# Patient Record
Sex: Male | Born: 1990 | Race: White | Hispanic: Yes | Marital: Single | Smoking: Never smoker
Health system: Southern US, Community
[De-identification: ages and names within clinical notes are randomized; demographics above are authoritative.]

## PROBLEM LIST (undated history)

## (undated) DIAGNOSIS — R12 Heartburn: Secondary | ICD-10-CM

## (undated) DIAGNOSIS — F4320 Adjustment disorder, unspecified: Secondary | ICD-10-CM

## (undated) DIAGNOSIS — F419 Anxiety disorder, unspecified: Secondary | ICD-10-CM

## (undated) DIAGNOSIS — K859 Acute pancreatitis without necrosis or infection, unspecified: Secondary | ICD-10-CM

## (undated) DIAGNOSIS — T7840XA Allergy, unspecified, initial encounter: Secondary | ICD-10-CM

## (undated) DIAGNOSIS — L700 Acne vulgaris: Secondary | ICD-10-CM

## (undated) DIAGNOSIS — K219 Gastro-esophageal reflux disease without esophagitis: Secondary | ICD-10-CM

## (undated) HISTORY — DX: Acute pancreatitis without necrosis or infection, unspecified: K85.90

## (undated) HISTORY — DX: Heartburn: R12

## (undated) HISTORY — DX: Acne vulgaris: L70.0

## (undated) HISTORY — DX: Anxiety disorder, unspecified: F41.9

## (undated) HISTORY — DX: Gastro-esophageal reflux disease without esophagitis: K21.9

## (undated) HISTORY — DX: Adjustment disorder, unspecified: F43.20

## (undated) HISTORY — DX: Allergy, unspecified, initial encounter: T78.40XA

---

## 2014-01-13 ENCOUNTER — Encounter: Payer: Self-pay | Admitting: Family Medicine

## 2014-07-21 NOTE — Progress Notes (Signed)
This encounter was created in error - please disregard.

## 2014-09-17 DIAGNOSIS — F4320 Adjustment disorder, unspecified: Secondary | ICD-10-CM

## 2014-09-17 HISTORY — DX: Adjustment disorder, unspecified: F43.20

## 2014-11-26 ENCOUNTER — Encounter: Payer: Self-pay | Admitting: Medical

## 2014-11-26 ENCOUNTER — Other Ambulatory Visit (HOSPITAL_COMMUNITY)
Admission: RE | Admit: 2014-11-26 | Discharge: 2014-11-26 | Disposition: A | Discharge status: Home / Self Care | Payer: Self-pay | Source: Ambulatory Visit | Attending: Medical | Admitting: Medical

## 2014-11-26 ENCOUNTER — Ambulatory Visit (INDEPENDENT_AMBULATORY_CARE_PROVIDER_SITE_OTHER): Payer: Self-pay | Admitting: Medical

## 2014-11-26 VITALS — BP 112/74 | HR 73 | Temp 98.1°F | Ht 67.2 in | Wt 123.0 lb

## 2014-11-26 DIAGNOSIS — Z8719 Personal history of other diseases of the digestive system: Secondary | ICD-10-CM

## 2014-11-26 DIAGNOSIS — R300 Dysuria: Secondary | ICD-10-CM

## 2014-11-26 DIAGNOSIS — R82998 Other abnormal findings in urine: Secondary | ICD-10-CM

## 2014-11-26 DIAGNOSIS — A5601 Chlamydial cystitis and urethritis: Secondary | ICD-10-CM | POA: Insufficient documentation

## 2014-11-26 DIAGNOSIS — N341 Nonspecific urethritis: Secondary | ICD-10-CM

## 2014-11-26 DIAGNOSIS — N390 Urinary tract infection, site not specified: Secondary | ICD-10-CM

## 2014-11-26 DIAGNOSIS — R739 Hyperglycemia, unspecified: Secondary | ICD-10-CM

## 2014-11-26 DIAGNOSIS — Z113 Encounter for screening for infections with a predominantly sexual mode of transmission: Secondary | ICD-10-CM | POA: Insufficient documentation

## 2014-11-26 DIAGNOSIS — Z202 Contact with and (suspected) exposure to infections with a predominantly sexual mode of transmission: Secondary | ICD-10-CM

## 2014-11-26 LAB — LIPASE: Lipase: 57 U/L (ref 11.0–59.0)

## 2014-11-26 LAB — POCT URINALYSIS DIPSTICK
GLUCOSE UA: NEGATIVE
Ketones, UA: NEGATIVE
Leukocytes, UA: NEGATIVE
Nitrite, UA: NEGATIVE
Protein, UA: 30
SPEC GRAV UA: 1.025
UROBILINOGEN UA: 0.2
pH, UA: 6

## 2014-11-26 LAB — HEMOGLOBIN A1C: Hgb A1c MFr Bld: 5 % (ref 4.6–6.5)

## 2014-11-26 MED ORDER — AZITHROMYCIN 250 MG PO TABS: ORAL_TABLET | ORAL | Status: DC

## 2014-11-26 NOTE — Progress Notes (Signed)
Subjective:    Patient ID: George Klein, male    DOB: 08-14-1991, 24 y.o.   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  Pt in states he went to ED about 2 weeks ago. [facility] [place]. He was diagnosed with uti per pt.  Review of records showed US showed bilateral epidymal cyst. Pt states testicle pain on lt side resolved after one day and no return. Pt was given cephalexin but bothered his stomach and he could not take any more than 4 tabs.  Pt cbc and cmp looked good. BS only 107. No elevated white count.  Pt states recently his pain on urination came on about one week ago. This time no testicle pain. No dc from penis. No vesicles or lesions.  2 months since last time had sex.  Pt has been taking azo.  Review of Systems  Constitutional: Negative for fever, chills and fatigue.  Respiratory: Negative for cough, chest tightness, shortness of breath and wheezing.   Cardiovascular: Negative for chest pain and palpitations.  Gastrointestinal: Negative for abdominal pain.  Genitourinary: Positive for dysuria. Negative for urgency, frequency, flank pain, discharge, penile swelling, scrotal swelling, difficulty urinating, genital sores, penile pain and testicular pain.  Musculoskeletal: Negative for back pain.   Past Medical History  Diagnosis Date  . Pancreatitis     Pt states occured after one month of cephalexin. He was drinking alcohol very light and rare. But states preceding month he did not drink alchohol.    History   Social History  . Marital Status: Single    Spouse Name: N/A  . Number of Children: N/A  . Years of Education: N/A   Occupational History  . Not on file.   Social History Main Topics  . Smoking status: Never Smoker   . Smokeless tobacco: Not on  file  . Alcohol Use: Yes     Comment: 2 beers every other week.  . Drug Use: No  . Sexual Activity: No   Other Topics Concern  . Not on file   Social History Narrative    No past surgical history on file.  No family history on file.  Allergies  Allergen Reactions  . Keflex [Cephalexin] Nausea And Vomiting    Current Outpatient Prescriptions on File Prior to Visit  Medication Sig Dispense Refill  . clindamycin (CLEOCIN T) 1 % lotion Apply topically 2 (two) times daily.     No current facility-administered medications on file prior to visit.    BP 112/74 mmHg  Pulse 73  Temp(Src) 98.1 F (36.7 C) (Oral)  Ht 5' 7.2" (1.707 m)  Wt 123 lb (55.792 kg)  BMI 19.15 kg/m2  SpO2 100%      Objective:   Physical Exam  General Appearance- Not in acute distress.  HEENT Eyes- Scleraeral/Conjuntiva-bilat- Not Yellow. Mouth & Throat- Normal.  Chest and Lung Exam Auscultation: Breath sounds:-Normal. Adventitious sounds:- No Adventitious sounds.  Cardiovascular Auscultation:Rythm - Regular. Heart Sounds -Normal heart sounds.  Abdomen Inspection:-Inspection Normal.  Palpation/Perucssion: Palpation and Percussion of the abdomen reveal- Non Tender, No Rebound tenderness, No rigidity(Guarding) and No Palpable abdominal masses.  Liver:-Normal.  Spleen:- Normal.   Genital exam- Uncircumcised .faint water appearing dc over glands penis. No cream dc. Testicles nontender. No hernias.      Assessment &  Plan:

## 2014-11-26 NOTE — Assessment & Plan Note (Signed)
Will get urine ancillary studies for recent symptoms and possible exposure to std.

## 2014-11-26 NOTE — Patient Instructions (Signed)
Nonspecific urethritis Will rx azithromycin. Since you have hx of GI pain with cephalexin I think azithromycin better option compared to doxycycline.   However both may cause GI symptoms.   Dysuria Will get urine ancillary studies for recent symptoms and possible exposure to std.   Hyperglycemia Being uncirumcised can lead to fungal irritation if sugar is high. Hx of mild sugar increase in ED. Will get 3 months bs average today.   History of pancreatitis 2 years ago. Will get lipase today when drawing labs.      If all labs negative and urinary symptoms persist then consider referral to urologist. Follow up in 2 weeks or as needed.

## 2014-11-26 NOTE — Assessment & Plan Note (Signed)
Being uncirumcised can lead to fungal irritation if sugar is high. Hx of mild sugar increase in ED. Will get 3 months bs average today.

## 2014-11-26 NOTE — Progress Notes (Signed)
Pre visit review using our clinic review tool, if applicable. No additional management support is needed unless otherwise documented below in the visit note. 

## 2014-11-26 NOTE — Assessment & Plan Note (Signed)
Will rx azithromycin. Since you have hx of GI pain with cephalexin I think azithromycin better option compared to doxycycline.   However both may cause GI symptoms.

## 2014-11-26 NOTE — Assessment & Plan Note (Signed)
2 years ago. Will get lipase today when drawing labs.

## 2014-11-27 LAB — URINE CULTURE

## 2014-11-29 LAB — URINE CYTOLOGY ANCILLARY ONLY
CANDIDA VAGINITIS: NEGATIVE
CHLAMYDIA, DNA PROBE: POSITIVE — AB
Neisseria Gonorrhea: NEGATIVE
Trichomonas: NEGATIVE

## 2014-11-30 ENCOUNTER — Telehealth: Payer: Self-pay | Admitting: Medical

## 2014-11-30 NOTE — Telephone Encounter (Signed)
Note regarding chlamydia

## 2014-12-09 ENCOUNTER — Ambulatory Visit: Payer: Self-pay | Admitting: Medical

## 2014-12-15 ENCOUNTER — Encounter: Payer: Self-pay | Admitting: Medical

## 2014-12-15 ENCOUNTER — Telehealth: Payer: Self-pay | Admitting: Medical

## 2014-12-15 NOTE — Telephone Encounter (Signed)
Do no charge. But can someone call him and explain policy.

## 2014-12-15 NOTE — Telephone Encounter (Signed)
PT was no show for appt on 12/09/2014- letter sent- charge?

## 2014-12-28 ENCOUNTER — Ambulatory Visit (INDEPENDENT_AMBULATORY_CARE_PROVIDER_SITE_OTHER): Payer: Self-pay | Admitting: Medical

## 2014-12-28 ENCOUNTER — Telehealth: Payer: Self-pay | Admitting: Medical

## 2014-12-28 ENCOUNTER — Encounter: Payer: Self-pay | Admitting: Medical

## 2014-12-28 ENCOUNTER — Other Ambulatory Visit (HOSPITAL_COMMUNITY)
Admission: RE | Admit: 2014-12-28 | Discharge: 2014-12-28 | Disposition: A | Discharge status: Home / Self Care | Payer: Self-pay | Source: Ambulatory Visit | Attending: Medical | Admitting: Medical

## 2014-12-28 VITALS — BP 116/75 | HR 72 | Temp 98.2°F | Ht 67.2 in | Wt 123.6 lb

## 2014-12-28 DIAGNOSIS — N342 Other urethritis: Secondary | ICD-10-CM

## 2014-12-28 DIAGNOSIS — Z113 Encounter for screening for infections with a predominantly sexual mode of transmission: Secondary | ICD-10-CM | POA: Insufficient documentation

## 2014-12-28 DIAGNOSIS — R309 Painful micturition, unspecified: Secondary | ICD-10-CM

## 2014-12-28 LAB — POCT URINALYSIS DIPSTICK
Bilirubin, UA: NEGATIVE
Glucose, UA: NEGATIVE
Ketones, UA: NEGATIVE
LEUKOCYTES UA: NEGATIVE
Nitrite, UA: NEGATIVE
PROTEIN UA: 15
Spec Grav, UA: 1.025
Urobilinogen, UA: 0.2
pH, UA: 6

## 2014-12-28 MED ORDER — DOXYCYCLINE HYCLATE 100 MG PO TABS: 100.0000 mg | ORAL_TABLET | Freq: Two times a day (BID) | ORAL | Status: DC

## 2014-12-28 NOTE — Progress Notes (Signed)
Pre visit review using our clinic review tool, if applicable. No additional management support is needed unless otherwise documented below in the visit note. 

## 2014-12-28 NOTE — Assessment & Plan Note (Signed)
Possible recurrent chlamydia vs nonspecific urethritis. Rx doxycycline. Would get girlfriend evaluated again in light of fact she is your only contact.  We will contact you on your ancillary studies.

## 2014-12-28 NOTE — Patient Instructions (Signed)
Urethritis Possible recurrent chlamydia vs nonspecific urethritis. Rx doxycycline. Would get girlfriend evaluated again in light of fact she is your only contact.  We will contact you on your ancillary studies.    Follow up 7 days pcp or prn.

## 2014-12-28 NOTE — Progress Notes (Signed)
   Subjective:    Patient ID: George Klein, male    DOB: 1991/05/15, 24 y.o.   MRN: [id number]  HPI   Pt in states that he has some mild pain occasional at tip of penis. He states this occurs when he gets an erection. But also some pain at times after he urinates. He had + chlamydia on urine ancillary studies a month ago and took azithromycin. Pt had sex with his girlfriend. Pt not sure if his girlfriend was treated for chlamydia. Pt states girlfriend was out of town and he thinks she did not go to see provider. Then girlfriend mentions she may have gotten voicemail from health dept.  Later he states she went but they did not treat his girlfriend.     Review of Systems  Constitutional: Negative for fever, chills and fatigue.  Respiratory: Negative for cough, chest tightness, shortness of breath and wheezing.   Cardiovascular: Negative for chest pain and palpitations.  Gastrointestinal: Negative for abdominal pain.  Genitourinary: Positive for dysuria and difficulty urinating. Negative for urgency, frequency, hematuria, flank pain, genital sores, penile pain and testicular pain.  Musculoskeletal: Negative for back pain.  Hematological: Negative for adenopathy. Does not bruise/bleed easily.   Past Medical History  Diagnosis Date  . Pancreatitis     Pt states occured after one month of cephalexin. He was drinking alcohol very light and rare. But states preceding month he did not drink alchohol.    History   Social History  . Marital Status: Single    Spouse Name: N/A  . Number of Children: N/A  . Years of Education: N/A   Occupational History  . Not on file.   Social History Main Topics  . Smoking status: Never Smoker   . Smokeless tobacco: Not on file  . Alcohol Use: Yes     Comment: 2 beers every other week.  . Drug Use: No  . Sexual Activity: No   Other Topics Concern  . Not on file   Social History Narrative    No past surgical history on file.  No family  history on file.  Allergies  Allergen Reactions  . Keflex [Cephalexin] Nausea And Vomiting    Current Outpatient Prescriptions on File Prior to Visit  Medication Sig Dispense Refill  . clindamycin (CLEOCIN T) 1 % lotion Apply topically 2 (two) times daily.    Marland Kitchen. tretinoin (RETIN-A) 0.01 % gel Apply topically at bedtime.     No current facility-administered medications on file prior to visit.    BP 116/75 mmHg  Pulse 72  Temp(Src) 98.2 F (36.8 C) (Oral)  Ht 5' 7.2" (1.707 m)  Wt 123 lb 9.6 oz (56.065 kg)  BMI 19.24 kg/m2  SpO2 97%       Objective:   Physical Exam  General Appearance- Not in acute distress.    Chest and Lung Exam Auscultation: Breath sounds:-Normal. Adventitious sounds:- No Adventitious sounds.  Cardiovascular Auscultation:Rythm - Regular. Heart Sounds -Normal heart sounds.  Abdomen Inspection:-Inspection Normal.  Palpation/Perucssion: Palpation and Percussion of the abdomen reveal- Non Tender, No Rebound tenderness, No rigidity(Guarding) and No Palpable abdominal masses.  Liver:-Normal.  Spleen:- Normal.   Genital- uncircumcised. No lesion on penis. No dc.       Assessment & Plan:

## 2014-12-28 NOTE — Telephone Encounter (Signed)
Not to lpn. To go ancillary urine studies.

## 2014-12-29 LAB — URINE CYTOLOGY ANCILLARY ONLY
CHLAMYDIA, DNA PROBE: NEGATIVE
NEISSERIA GONORRHEA: NEGATIVE
Trichomonas: NEGATIVE

## 2014-12-30 LAB — URINE CYTOLOGY ANCILLARY ONLY: Candida vaginitis: NEGATIVE

## 2015-06-28 ENCOUNTER — Encounter: Payer: Self-pay | Admitting: Medical

## 2015-06-28 ENCOUNTER — Ambulatory Visit (INDEPENDENT_AMBULATORY_CARE_PROVIDER_SITE_OTHER): Payer: Self-pay | Admitting: Medical

## 2015-06-28 VITALS — BP 110/70 | HR 64 | Temp 97.4°F | Ht 67.0 in | Wt 120.0 lb

## 2015-06-28 DIAGNOSIS — J0120 Acute ethmoidal sinusitis, unspecified: Secondary | ICD-10-CM

## 2015-06-28 DIAGNOSIS — J309 Allergic rhinitis, unspecified: Secondary | ICD-10-CM

## 2015-06-28 MED ORDER — FLUTICASONE PROPIONATE 50 MCG/ACT NA SUSP: 2.0000 | Freq: Every day | NASAL | Status: DC

## 2015-06-28 MED ORDER — DOXYCYCLINE HYCLATE 100 MG PO TABS: 100.0000 mg | ORAL_TABLET | Freq: Two times a day (BID) | ORAL | Status: DC

## 2015-06-28 MED ORDER — LORATADINE 10 MG PO TABS: 10.0000 mg | ORAL_TABLET | Freq: Every day | ORAL | Status: DC

## 2015-06-28 NOTE — Patient Instructions (Signed)
Allergic rhinitis likely- rx flonase nasal spray. If needed can use claritin as well.  Sinusitis- some ethmoid sinus pressure presently. I want you to use above first. If you sinus pressure worsens or changes then start doxycycline.  Follow up 7-10 days any persisting symptoms or as needed.

## 2015-06-28 NOTE — Progress Notes (Signed)
Pre visit review using our clinic review tool, if applicable. No additional management support is needed unless otherwise documented below in the visit note. 

## 2015-06-28 NOTE — Progress Notes (Signed)
Subjective:    Patient ID: George Klein, male    DOB: 09-23-1990, 24 y.o.   MRN: [id number]  HPI   Pt in with nasal congestion and sinus congestion. Pt eyes have been itching. Some sneezing. No runny nose. Reports no pnd. These symptoms have been going on 2 wks.  Early on he felt fever.  When he leans over describes pain in ethomoid sinus region.     Review of Systems  Constitutional: Positive for fever. Negative for chills and fatigue.  HENT: Positive for congestion, sinus pressure and sneezing. Negative for facial swelling, postnasal drip, rhinorrhea and sore throat.   Eyes: Positive for itching.  Respiratory: Negative for cough, choking, chest tightness, shortness of breath and wheezing.   Cardiovascular: Negative for chest pain and palpitations.  Gastrointestinal: Negative for abdominal pain.  Musculoskeletal: Negative for back pain.  Neurological: Negative for dizziness and headaches.  Hematological: Negative for adenopathy. Does not bruise/bleed easily.  Psychiatric/Behavioral: Negative for behavioral problems and confusion.   Past Medical History  Diagnosis Date  . Pancreatitis     Pt states occured after one month of cephalexin. He was drinking alcohol very light and rare. But states preceding month he did not drink alchohol.    Social History   Social History  . Marital Status: Single    Spouse Name: N/A  . Number of Children: N/A  . Years of Education: N/A   Occupational History  . Not on file.   Social History Main Topics  . Smoking status: Never Smoker   . Smokeless tobacco: Not on file  . Alcohol Use: Yes     Comment: 2 beers every other week.  . Drug Use: No  . Sexual Activity: No   Other Topics Concern  . Not on file   Social History Narrative    No past surgical history on file.  No family history on file.  Allergies  Allergen Reactions  . Keflex [Cephalexin] Nausea And Vomiting    No current outpatient prescriptions on file  prior to visit.   No current facility-administered medications on file prior to visit.    BP 110/70 mmHg  Pulse 64  Temp(Src) 97.4 F (36.3 C) (Oral)  Ht  (1.702 m)  Wt 120 lb (54.432 kg)  BMI 18.79 kg/m2  SpO2 98%      Objective:   Physical Exam  General  Mental Status - Alert. General Appearance - Well groomed. Not in acute distress.  Skin Rashes- No Rashes.  HEENT Head- Normal. Ear Auditory Canal - Left- Normal. Right - Normal.Tympanic Membrane- Left- Normal. Right- Normal. Eye Sclera/Conjunctiva- Left- Normal. Right- Normal. Nose & Sinuses Nasal Mucosa- Left-  Boggy and Congested. Right-  Boggy and  Congested.Bilateral no  maxillary and no  frontal sinus pressure.(but some ethmoid sinus region pain). Mouth & Throat Lips: Upper Lip- Normal: no dryness, cracking, pallor, cyanosis, or vesicular eruption. Lower Lip-Normal: no dryness, cracking, pallor, cyanosis or vesicular eruption. Buccal Mucosa- Bilateral- No Aphthous ulcers. Oropharynx- No Discharge or Erythema. +pnd. Tonsils: Characteristics- Bilateral- No Erythema or Congestion. Size/Enlargement- Bilateral- No enlargement. Discharge- bilateral-None.  Neck Neck- Supple. No Masses.   Chest and Lung Exam Auscultation: Breath Sounds:-Clear even and unlabored.  Cardiovascular Auscultation:Rythm- Regular, rate and rhythm. Murmurs & Other Heart Sounds:Ausculatation of the heart reveal- No Murmurs.  Lymphatic Head & Neck General Head & Neck Lymphatics: Bilateral: Description- No Localized lymphadenopathy.       Assessment & Plan:  Allergic rhinitis likely- rx flonase nasal  spray. If needed can use claritin as well.  Sinusitis- some ethmoid sinus pressure presently. I want you to use above first. If you sinus pressure worsens or changes then start doxycycline.  Follow up 7-10 days any persisting symptoms or as needed.

## 2015-07-18 ENCOUNTER — Ambulatory Visit (INDEPENDENT_AMBULATORY_CARE_PROVIDER_SITE_OTHER): Payer: Self-pay | Admitting: Medical

## 2015-07-18 ENCOUNTER — Encounter: Payer: Self-pay | Admitting: Medical

## 2015-07-18 VITALS — BP 102/60 | HR 67 | Temp 98.3°F | Ht 67.0 in | Wt 119.0 lb

## 2015-07-18 DIAGNOSIS — Z0189 Encounter for other specified special examinations: Secondary | ICD-10-CM

## 2015-07-18 DIAGNOSIS — E875 Hyperkalemia: Secondary | ICD-10-CM

## 2015-07-18 DIAGNOSIS — Z0000 Encounter for general adult medical examination without abnormal findings: Secondary | ICD-10-CM

## 2015-07-18 DIAGNOSIS — Z113 Encounter for screening for infections with a predominantly sexual mode of transmission: Secondary | ICD-10-CM

## 2015-07-18 DIAGNOSIS — Z23 Encounter for immunization: Secondary | ICD-10-CM

## 2015-07-18 LAB — POCT URINALYSIS DIPSTICK
Bilirubin, UA: NEGATIVE
Blood, UA: NEGATIVE
Glucose, UA: NEGATIVE
Ketones, UA: NEGATIVE
LEUKOCYTES UA: NEGATIVE
Nitrite, UA: NEGATIVE
PROTEIN UA: NEGATIVE
Spec Grav, UA: 1.025
UROBILINOGEN UA: 0.2
pH, UA: 7

## 2015-07-18 NOTE — Assessment & Plan Note (Addendum)
Will get cbc, cmp, tsh, lipid panel today. Check ua as well. Will get flu vaccine today.

## 2015-07-18 NOTE — Progress Notes (Signed)
Pre visit review using our clinic review tool, if applicable. No additional management support is needed unless otherwise documented below in the visit note. 

## 2015-07-18 NOTE — Progress Notes (Signed)
Subjective:    Patient ID: George Klein, male    DOB: Apr 28, 1991, 24 y.o.   MRN: [id number]  HPI  Pt in for evaluation. Pt states his mother wanted him to have a physical.  Pt has not gotten flu vaccine this year.  Pt is fasting today.   Review of Systems  Constitutional: Negative for fever, chills, diaphoresis, activity change and fatigue.  HENT: Negative for dental problem, ear discharge, ear pain, mouth sores, nosebleeds, rhinorrhea and sinus pressure.        Pt states when he swallows has odd sensation over his thyroid.  Eyes:       Eys feel tired.  Respiratory: Negative for cough, chest tightness and shortness of breath.   Cardiovascular: Negative for chest pain, palpitations and leg swelling.  Gastrointestinal: Positive for constipation. Negative for nausea, vomiting and abdominal pain.       Pt mentioned in July one week of mild constipation feellng. Then this resolved.  Some mild reflux symptoms as well.   Musculoskeletal: Negative for back pain, neck pain and neck stiffness.       Pt mentions that he gets occasionally tingling sensation to his feet.   Skin: Negative for rash.  Neurological: Negative for dizziness, syncope, facial asymmetry, speech difficulty, weakness and headaches.       Thinks some slight head pressure sensations.  Hematological: Negative for adenopathy. Does not bruise/bleed easily.  Psychiatric/Behavioral: Negative for behavioral problems, confusion and agitation. The patient is nervous/anxious.        Pt states about 3 times a week will get very nervous sensation 15-20 minutes then will subside.  Described sensation on panic extreme worry about 2 months ago.   Past Medical History  Diagnosis Date  . Pancreatitis     Pt states occured after one month of cephalexin. He was drinking alcohol very light and rare. But states preceding month he did not drink alchohol.    Social History   Social History  . Marital Status: Single    Spouse Name:  N/A  . Number of Children: N/A  . Years of Education: N/A   Occupational History  . Not on file.   Social History Main Topics  . Smoking status: Never Smoker   . Smokeless tobacco: Not on file  . Alcohol Use: Yes     Comment: 2 beers every other week.  . Drug Use: No  . Sexual Activity: No   Other Topics Concern  . Not on file   Social History Narrative    No past surgical history on file.  No family history on file.  Allergies  Allergen Reactions  . Keflex [Cephalexin] Nausea And Vomiting    Current Outpatient Prescriptions on File Prior to Visit  Medication Sig Dispense Refill  . doxycycline (VIBRA-TABS) 100 MG tablet Take 1 tablet (100 mg total) by mouth 2 (two) times daily. 20 tablet 0  . fluticasone (FLONASE) 50 MCG/ACT nasal spray Place 2 sprays into both nostrils daily. 16 g 1  . loratadine (CLARITIN) 10 MG tablet Take 1 tablet (10 mg total) by mouth daily. 30 tablet 0   No current facility-administered medications on file prior to visit.    BP 102/60 mmHg  Pulse 67  Temp(Src) 98.3 F (36.8 C) (Oral)  Ht  (1.702 m)  Wt 119 lb (53.978 kg)  BMI 18.63 kg/m2  SpO2 98%       Objective:   Physical Exam  General Mental Status- Alert. General Appearance- Not  in acute distress.   Skin General: Color- Normal Color. Moisture- Normal Moisture.  Neck Carotid Arteries- Normal color. Moisture- Normal Moisture. No carotid bruits. No JVD.  Chest and Lung Exam Auscultation: Breath Sounds:-Normal.  Cardiovascular Auscultation:Rythm- Regular, Rate and rhythm. Murmurs & Other Heart Sounds:Auscultation of the heart reveals- No Murmurs.  Abdomen Inspection:-Inspeection Normal. Palpation/Percussion:Note:No mass. Palpation and Percussion of the abdomen reveal- Non Tender, Non Distended + BS, no rebound or guarding.    Neurologic Cranial Nerve exam:- CN III-XII intact(No nystagmus), symmetric smile. Strength:- 5/5 equal and symmetric strength both  upper and lower extremities.      Assessment & Plan:  .You mentioned various mild intermittent symptoms. For heart burn use zantac otc and eat healthy.  For your other intermittent signs and symptoms will consider further  work up for each if those symptoms  return or if physical exam labs show abnormality.  I will over you ssri type medication if your anxiety is daily/recurrent.  Follow up in 3 wks or as needed.

## 2015-07-18 NOTE — Addendum Note (Signed)
Addended by: Neldon LabellaMABE, Myla Mauriello S on: 07/18/2015 04:09 PM   Modules accepted: Orders

## 2015-07-18 NOTE — Patient Instructions (Addendum)
Wellness examination Will get cbc, cmp, tsh, lipid panel today. Check ua as well. Will get flu vaccine today.    You mentioned various mild intermittent symptoms. For heart burn use zantac otc and eat healthy.  For your other intermittent signs and symptoms will consider further  work up for each if those symptoms  return or if physical exam labs show abnormality.  I will over you ssri type medication if your anxiety is daily/recurrent.  Follow up in 3 wks or as needed.  Preventive Care for Adults, Male A healthy lifestyle and preventive care can promote health and wellness. Preventive health guidelines for men include the following key practices:  A routine yearly physical is a good way to check with your health care provider about your health and preventative screening. It is a chance to share any concerns and updates on your health and to receive a thorough exam.  Visit your dentist for a routine exam and preventative care every 6 months. Brush your teeth twice a day and floss once a day. Good oral hygiene prevents tooth decay and gum disease.  The frequency of eye exams is based on your age, health, family medical history, use of contact lenses, and other factors. Follow your health care provider's recommendations for frequency of eye exams.  Eat a healthy diet. Foods such as vegetables, fruits, whole grains, low-fat dairy products, and lean protein foods contain the nutrients you need without too many calories. Decrease your intake of foods high in solid fats, added sugars, and salt. Eat the right amount of calories for you.Get information about a proper diet from your health care provider, if necessary.  Regular physical exercise is one of the most important things you can do for your health. Most adults should get at least 150 minutes of moderate-intensity exercise (any activity that increases your heart rate and causes you to sweat) each week. In addition, most adults need  muscle-strengthening exercises on 2 or more days a week.  Maintain a healthy weight. The body mass index (BMI) is a screening tool to identify possible weight problems. It provides an estimate of body fat based on height and weight. Your health care provider can find your BMI and can help you achieve or maintain a healthy weight.For adults 20 years and older:  A BMI below 18.5 is considered underweight.  A BMI of 18.5 to 24.9 is normal.  A BMI of 25 to 29.9 is considered overweight.  A BMI of 30 and above is considered obese.  Maintain normal blood lipids and cholesterol levels by exercising and minimizing your intake of saturated fat. Eat a balanced diet with plenty of fruit and vegetables. Blood tests for lipids and cholesterol should begin at age 77 and be repeated every 5 years. If your lipid or cholesterol levels are high, you are over 50, or you are at high risk for heart disease, you may need your cholesterol levels checked more frequently.Ongoing high lipid and cholesterol levels should be treated with medicines if diet and exercise are not working.  If you smoke, find out from your health care provider how to quit. If you do not use tobacco, do not start.  Lung cancer screening is recommended for adults aged 52-80 years who are at high risk for developing lung cancer because of a history of smoking. A yearly low-dose CT scan of the lungs is recommended for people who have at least a 30-pack-year history of smoking and are a current smoker or have quit  within the past 15 years. A pack year of smoking is smoking an average of 1 pack of cigarettes a day for 1 year (for example: 1 pack a day for 30 years or 2 packs a day for 15 years). Yearly screening should continue until the smoker has stopped smoking for at least 15 years. Yearly screening should be stopped for people who develop a health problem that would prevent them from having lung cancer treatment.  If you choose to drink alcohol,  do not have more than 2 drinks per day. One drink is considered to be 12 ounces (355 mL) of beer, 5 ounces (148 mL) of wine, or 1.5 ounces (44 mL) of liquor.  Avoid use of street drugs. Do not share needles with anyone. Ask for help if you need support or instructions about stopping the use of drugs.  High blood pressure causes heart disease and increases the risk of stroke. Your blood pressure should be checked at least every 1-2 years. Ongoing high blood pressure should be treated with medicines, if weight loss and exercise are not effective.  If you are 5-4 years old, ask your health care provider if you should take aspirin to prevent heart disease.  Diabetes screening is done by taking a blood sample to check your blood glucose level after you have not eaten for a certain period of time (fasting). If you are not overweight and you do not have risk factors for diabetes, you should be screened once every 3 years starting at age 29. If you are overweight or obese and you are 62-75 years of age, you should be screened for diabetes every year as part of your cardiovascular risk assessment.  Colorectal cancer can be detected and often prevented. Most routine colorectal cancer screening begins at the age of 39 and continues through age 84. However, your health care provider may recommend screening at an earlier age if you have risk factors for colon cancer. On a yearly basis, your health care provider may provide home test kits to check for hidden blood in the stool. Use of a small camera at the end of a tube to directly examine the colon (sigmoidoscopy or colonoscopy) can detect the earliest forms of colorectal cancer. Talk to your health care provider about this at age 41, when routine screening begins. Direct exam of the colon should be repeated every 5-10 years through age 36, unless early forms of precancerous polyps or small growths are found.  People who are at an increased risk for hepatitis B  should be screened for this virus. You are considered at high risk for hepatitis B if:  You were born in a country where hepatitis B occurs often. Talk with your health care provider about which countries are considered high risk.  Your parents were born in a high-risk country and you have not received a shot to protect against hepatitis B (hepatitis B vaccine).  You have HIV or AIDS.  You use needles to inject street drugs.  You live with, or have sex with, someone who has hepatitis B.  You are a man who has sex with other men (MSM).  You get hemodialysis treatment.  You take certain medicines for conditions such as cancer, organ transplantation, and autoimmune conditions.  Hepatitis C blood testing is recommended for all people born from [birth year] through 1965 and any individual with known risks for hepatitis C.  Practice safe sex. Use condoms and avoid high-risk sexual practices to reduce the spread of sexually  transmitted infections (STIs). STIs include gonorrhea, chlamydia, syphilis, trichomonas, herpes, HPV, and human immunodeficiency virus (HIV). Herpes, HIV, and HPV are viral illnesses that have no cure. They can result in disability, cancer, and death.  If you are a man who has sex with other men, you should be screened at least once per year for:  HIV.  Urethral, rectal, and pharyngeal infection of gonorrhea, chlamydia, or both.  If you are at risk of being infected with HIV, it is recommended that you take a prescription medicine daily to prevent HIV infection. This is called preexposure prophylaxis (PrEP). You are considered at risk if:  You are a man who has sex with other men (MSM) and have other risk factors.  You are a heterosexual man, are sexually active, and are at increased risk for HIV infection.  You take drugs by injection.  You are sexually active with a partner who has HIV.  Talk with your health care provider about whether you are at high risk of being  infected with HIV. If you choose to begin PrEP, you should first be tested for HIV. You should then be tested every 3 months for as long as you are taking PrEP.  A one-time screening for abdominal aortic aneurysm (AAA) and surgical repair of large AAAs by ultrasound are recommended for men ages 71 to 35 years who are current or former smokers.  Healthy men should no longer receive prostate-specific antigen (PSA) blood tests as part of routine cancer screening. Talk with your health care provider about prostate cancer screening.  Testicular cancer screening is not recommended for adult males who have no symptoms. Screening includes self-exam, a health care provider exam, and other screening tests. Consult with your health care provider about any symptoms you have or any concerns you have about testicular cancer.  Use sunscreen. Apply sunscreen liberally and repeatedly throughout the day. You should seek shade when your shadow is shorter than you. Protect yourself by wearing long sleeves, pants, a wide-brimmed hat, and sunglasses year round, whenever you are outdoors.  Once a month, do a whole-body skin exam, using a mirror to look at the skin on your back. Tell your health care provider about new moles, moles that have irregular borders, moles that are larger than a pencil eraser, or moles that have changed in shape or color.  Stay current with required vaccines (immunizations).  Influenza vaccine. All adults should be immunized every year.  Tetanus, diphtheria, and acellular pertussis (Td, Tdap) vaccine. An adult who has not previously received Tdap or who does not know his vaccine status should receive 1 dose of Tdap. This initial dose should be followed by tetanus and diphtheria toxoids (Td) booster doses every 10 years. Adults with an unknown or incomplete history of completing a 3-dose immunization series with Td-containing vaccines should begin or complete a primary immunization series including  a Tdap dose. Adults should receive a Td booster every 10 years.  Varicella vaccine. An adult without evidence of immunity to varicella should receive 2 doses or a second dose if he has previously received 1 dose.  Human papillomavirus (HPV) vaccine. Males aged 11-21 years who have not received the vaccine previously should receive the 3-dose series. Males aged 22-26 years may be immunized. Immunization is recommended through the age of 56 years for any male who has sex with males and did not get any or all doses earlier. Immunization is recommended for any person with an immunocompromised condition through the age of 100  years if he did not get any or all doses earlier. During the 3-dose series, the second dose should be obtained 4-8 weeks after the first dose. The third dose should be obtained 24 weeks after the first dose and 16 weeks after the second dose.  Zoster vaccine. One dose is recommended for adults aged 47 years or older unless certain conditions are present.  Measles, mumps, and rubella (MMR) vaccine. Adults born before 55 generally are considered immune to measles and mumps. Adults born in [birth year] or later should have 1 or more doses of MMR vaccine unless there is a contraindication to the vaccine or there is laboratory evidence of immunity to each of the three diseases. A routine second dose of MMR vaccine should be obtained at least 28 days after the first dose for students attending postsecondary schools, health care workers, or international travelers. People who received inactivated measles vaccine or an unknown type of measles vaccine during 1963-1967 should receive 2 doses of MMR vaccine. People who received inactivated mumps vaccine or an unknown type of mumps vaccine before 1979 and are at high risk for mumps infection should consider immunization with 2 doses of MMR vaccine. Unvaccinated health care workers born before [birth year] who lack laboratory evidence of measles, mumps, or rubella  immunity or laboratory confirmation of disease should consider measles and mumps immunization with 2 doses of MMR vaccine or rubella immunization with 1 dose of MMR vaccine.  Pneumococcal 13-valent conjugate (PCV13) vaccine. When indicated, a person who is uncertain of his immunization history and has no record of immunization should receive the PCV13 vaccine. All adults 9 years of age and older should receive this vaccine. An adult aged 73 years or older who has certain medical conditions and has not been previously immunized should receive 1 dose of PCV13 vaccine. This PCV13 should be followed with a dose of pneumococcal polysaccharide (PPSV23) vaccine. Adults who are at high risk for pneumococcal disease should obtain the PPSV23 vaccine at least 8 weeks after the dose of PCV13 vaccine. Adults older than 22 years of age who have normal immune system function should obtain the PPSV23 vaccine dose at least 1 year after the dose of PCV13 vaccine.  Pneumococcal polysaccharide (PPSV23) vaccine. When PCV13 is also indicated, PCV13 should be obtained first. All adults aged 29 years and older should be immunized. An adult younger than age 61 years who has certain medical conditions should be immunized. Any person who resides in a nursing home or long-term care facility should be immunized. An adult smoker should be immunized. People with an immunocompromised condition and certain other conditions should receive both PCV13 and PPSV23 vaccines. People with human immunodeficiency virus (HIV) infection should be immunized as soon as possible after diagnosis. Immunization during chemotherapy or radiation therapy should be avoided. Routine use of PPSV23 vaccine is not recommended for American Indians, [place] Natives, or people younger than 65 years unless there are medical conditions that require PPSV23 vaccine. When indicated, people who have unknown immunization and have no record of immunization should receive PPSV23  vaccine. One-time revaccination 5 years after the first dose of PPSV23 is recommended for people aged 19-64 years who have chronic kidney failure, nephrotic syndrome, asplenia, or immunocompromised conditions. People who received 1-2 doses of PPSV23 before age 4 years should receive another dose of PPSV23 vaccine at age 90 years or later if at least 5 years have passed since the previous dose. Doses of PPSV23 are not needed for people immunized with PPSV23  at or after age 46 years.  Meningococcal vaccine. Adults with asplenia or persistent complement component deficiencies should receive 2 doses of quadrivalent meningococcal conjugate (MenACWY-D) vaccine. The doses should be obtained at least 2 months apart. Microbiologists working with certain meningococcal bacteria, [employer] recruits, people at risk during an outbreak, and people who travel to or live in countries with a high rate of meningitis should be immunized. A first-year college student up through age 65 years who is living in a residence hall should receive a dose if he did not receive a dose on or after his 16th birthday. Adults who have certain high-risk conditions should receive one or more doses of vaccine.  Hepatitis A vaccine. Adults who wish to be protected from this disease, have chronic liver disease, work with hepatitis A-infected animals, work in hepatitis A research labs, or travel to or work in countries with a high rate of hepatitis A should be immunized. Adults who were previously unvaccinated and who anticipate close contact with an international adoptee during the first 60 days after arrival in the [place] from a country with a high rate of hepatitis A should be immunized.  Hepatitis B vaccine. Adults should be immunized if they wish to be protected from this disease, are under age 62 years and have diabetes, have chronic liver disease, have had more than one sex partner in the past 6 months, may be exposed to blood or other  infectious body fluids, are household contacts or sex partners of hepatitis B positive people, are clients or workers in certain care facilities, or travel to or work in countries with a high rate of hepatitis B.  Haemophilus influenzae type b (Hib) vaccine. A previously unvaccinated person with asplenia or sickle cell disease or having a scheduled splenectomy should receive 1 dose of Hib vaccine. Regardless of previous immunization, a recipient of a hematopoietic stem cell transplant should receive a 3-dose series 6-12 months after his successful transplant. Hib vaccine is not recommended for adults with HIV infection. Preventive Service / Frequency Ages 50 to 57  Blood pressure check.** / Every 3-5 years.  Lipid and cholesterol check.** / Every 5 years beginning at age 55.  Hepatitis C blood test.** / For any individual with known risks for hepatitis C.  Skin self-exam. / Monthly.  Influenza vaccine. / Every year.  Tetanus, diphtheria, and acellular pertussis (Tdap, Td) vaccine.** / Consult your health care provider. 1 dose of Td every 10 years.  Varicella vaccine.** / Consult your health care provider.  HPV vaccine. / 3 doses over 6 months, if 91 or younger.  Measles, mumps, rubella (MMR) vaccine.** / You need at least 1 dose of MMR if you were born in 1957 or later. You may also need a second dose.  Pneumococcal 13-valent conjugate (PCV13) vaccine.** / Consult your health care provider.  Pneumococcal polysaccharide (PPSV23) vaccine.** / 1 to 2 doses if you smoke cigarettes or if you have certain conditions.  Meningococcal vaccine.** / 1 dose if you are age 89 to 68 years and a [employer] researcher living in a residence hall, or have one of several medical conditions. You may also need additional booster doses.  Hepatitis A vaccine.** / Consult your health care provider.  Hepatitis B vaccine.** / Consult your health care provider.  Haemophilus influenzae type b (Hib)  vaccine.** / Consult your health care provider. Ages 59 to 20  Blood pressure check.** / Every year.  Lipid and cholesterol check.** / Every 5 years beginning  at age 16.  Lung cancer screening. / Every year if you are aged 18-80 years and have a 30-pack-year history of smoking and currently smoke or have quit within the past 15 years. Yearly screening is stopped once you have quit smoking for at least 15 years or develop a health problem that would prevent you from having lung cancer treatment.  Fecal occult blood test (FOBT) of stool. / Every year beginning at age 38 and continuing until age 1. You may not have to do this test if you get a colonoscopy every 10 years.  Flexible sigmoidoscopy** or colonoscopy.** / Every 5 years for a flexible sigmoidoscopy or every 10 years for a colonoscopy beginning at age 23 and continuing until age 9.  Hepatitis C blood test.** / For all people born from [birth year] through 1965 and any individual with known risks for hepatitis C.  Skin self-exam. / Monthly.  Influenza vaccine. / Every year.  Tetanus, diphtheria, and acellular pertussis (Tdap/Td) vaccine.** / Consult your health care provider. 1 dose of Td every 10 years.  Varicella vaccine.** / Consult your health care provider.  Zoster vaccine.** / 1 dose for adults aged 5 years or older.  Measles, mumps, rubella (MMR) vaccine.** / You need at least 1 dose of MMR if you were born in 1957 or later. You may also need a second dose.  Pneumococcal 13-valent conjugate (PCV13) vaccine.** / Consult your health care provider.  Pneumococcal polysaccharide (PPSV23) vaccine.** / 1 to 2 doses if you smoke cigarettes or if you have certain conditions.  Meningococcal vaccine.** / Consult your health care provider.  Hepatitis A vaccine.** / Consult your health care provider.  Hepatitis B vaccine.** / Consult your health care provider.  Haemophilus influenzae type b (Hib) vaccine.** / Consult your health care  provider. Ages 79 and over  Blood pressure check.** / Every year.  Lipid and cholesterol check.**/ Every 5 years beginning at age 71.  Lung cancer screening. / Every year if you are aged 16-80 years and have a 30-pack-year history of smoking and currently smoke or have quit within the past 15 years. Yearly screening is stopped once you have quit smoking for at least 15 years or develop a health problem that would prevent you from having lung cancer treatment.  Fecal occult blood test (FOBT) of stool. / Every year beginning at age 39 and continuing until age 29. You may not have to do this test if you get a colonoscopy every 10 years.  Flexible sigmoidoscopy** or colonoscopy.** / Every 5 years for a flexible sigmoidoscopy or every 10 years for a colonoscopy beginning at age 87 and continuing until age 34.  Hepatitis C blood test.** / For all people born from [birth year] through 1963 and any individual with known risks for hepatitis C.  Abdominal aortic aneurysm (AAA) screening.** / A one-time screening for ages 36 to 8 years who are current or former smokers.  Skin self-exam. / Monthly.  Influenza vaccine. / Every year.  Tetanus, diphtheria, and acellular pertussis (Tdap/Td) vaccine.** / 1 dose of Td every 10 years.  Varicella vaccine.** / Consult your health care provider.  Zoster vaccine.** / 1 dose for adults aged 47 years or older.  Pneumococcal 13-valent conjugate (PCV13) vaccine.** / 1 dose for all adults aged 19 years and older.  Pneumococcal polysaccharide (PPSV23) vaccine.** / 1 dose for all adults aged 36 years and older.  Meningococcal vaccine.** / Consult your health care provider.  Hepatitis A vaccine.** / Consult your health  care provider.  Hepatitis B vaccine.** / Consult your health care provider.  Haemophilus influenzae type b (Hib) vaccine.** / Consult your health care provider. **Family history and personal history of risk and conditions may change your health care  provider's recommendations.   This information is not intended to replace advice given to you by your health care provider. Make sure you discuss any questions you have with your health care provider.   Document Released: 10/30/2001 Document Revised: 09/24/2014 Document Reviewed: 01/29/2011 Elsevier Interactive Patient Education [employer].

## 2015-07-19 LAB — CBC WITH DIFFERENTIAL/PLATELET
Basophils Absolute: 0 10*3/uL (ref 0.0–0.1)
Basophils Relative: 0.3 % (ref 0.0–3.0)
EOS PCT: 1.1 % (ref 0.0–5.0)
Eosinophils Absolute: 0.1 10*3/uL (ref 0.0–0.7)
HCT: 42.4 % (ref 39.0–52.0)
HEMOGLOBIN: 14.4 g/dL (ref 13.0–17.0)
Lymphocytes Relative: 23.9 % (ref 12.0–46.0)
Lymphs Abs: 1.6 10*3/uL (ref 0.7–4.0)
MCHC: 34 g/dL (ref 30.0–36.0)
MCV: 90.2 fl (ref 78.0–100.0)
Monocytes Absolute: 0.4 10*3/uL (ref 0.1–1.0)
Monocytes Relative: 5.5 % (ref 3.0–12.0)
Neutro Abs: 4.7 10*3/uL (ref 1.4–7.7)
Neutrophils Relative %: 69.2 % (ref 43.0–77.0)
Platelets: 288 10*3/uL (ref 150.0–400.0)
RBC: 4.7 Mil/uL (ref 4.22–5.81)
RDW: 13.8 % (ref 11.5–15.5)
WBC: 6.8 10*3/uL (ref 4.0–10.5)

## 2015-07-19 LAB — COMPREHENSIVE METABOLIC PANEL
ALBUMIN: 4.4 g/dL (ref 3.5–5.2)
ALK PHOS: 102 U/L (ref 39–117)
ALT: 17 U/L (ref 0–53)
AST: 18 U/L (ref 0–37)
BUN: 9 mg/dL (ref 6–23)
CO2: 31 mEq/L (ref 19–32)
CREATININE: 0.78 mg/dL (ref 0.40–1.50)
Calcium: 10.1 mg/dL (ref 8.4–10.5)
Chloride: 105 mEq/L (ref 96–112)
GFR: 131.95 mL/min (ref >60.00)
Glucose, Bld: 95 mg/dL (ref 70–99)
POTASSIUM: 5.2 meq/L — AB (ref 3.5–5.1)
Sodium: 143 mEq/L (ref 135–145)
TOTAL PROTEIN: 8 g/dL (ref 6.0–8.3)
Total Bilirubin: 0.8 mg/dL (ref 0.2–1.2)

## 2015-07-19 LAB — TSH: TSH: 0.5 u[IU]/mL (ref 0.35–4.50)

## 2015-07-19 LAB — LIPID PANEL
Cholesterol: 116 mg/dL (ref 0–200)
HDL: 48.2 mg/dL (ref >39.00)
LDL Cholesterol: 53 mg/dL (ref 0–99)
NonHDL: 67.66
Total CHOL/HDL Ratio: 2
Triglycerides: 71 mg/dL (ref 0.0–149.0)
VLDL: 14.2 mg/dL (ref 0.0–40.0)

## 2015-07-20 NOTE — Addendum Note (Signed)
Addended by: Neldon LabellaMABE, HOLDEN S on: 07/20/2015 08:55 AM   Modules accepted: Orders

## 2015-08-10 ENCOUNTER — Ambulatory Visit (INDEPENDENT_AMBULATORY_CARE_PROVIDER_SITE_OTHER): Payer: Self-pay | Admitting: Medical

## 2015-08-10 ENCOUNTER — Encounter: Payer: Self-pay | Admitting: Medical

## 2015-08-10 VITALS — BP 118/62 | HR 88 | Temp 97.9°F | Ht 67.0 in | Wt 123.0 lb

## 2015-08-10 DIAGNOSIS — F4322 Adjustment disorder with anxiety: Secondary | ICD-10-CM

## 2015-08-10 MED ORDER — SERTRALINE HCL 25 MG PO TABS: 25.0000 mg | ORAL_TABLET | Freq: Every day | ORAL | Status: DC

## 2015-08-10 NOTE — Progress Notes (Signed)
   Subjective:    Patient ID: George Klein, male    DOB: [birth year], 24 y.o.   MRN: [id number]  HPI  Pt states since last exam he mentioned some anxiety. Still some anxious. I wanted to follow up with him since he mentioned this on physical exam. He does report some obsessive/ over thinking at times. But when he plays soccer or video games he can relax. Describes low level anxiety. If he reads news will get anxious.  Pt not sure if he will use rx but willing to think about using the med.   Review of Systems  Constitutional: Negative for fever, chills, diaphoresis, activity change and fatigue.  Respiratory: Negative for cough, chest tightness and shortness of breath.   Cardiovascular: Negative for chest pain, palpitations and leg swelling.  Gastrointestinal: Negative for nausea, vomiting and abdominal pain.  Musculoskeletal: Negative for neck pain and neck stiffness.  Neurological: Negative for dizziness, tremors, seizures, syncope, facial asymmetry, speech difficulty, weakness, light-headedness, numbness and headaches.  Psychiatric/Behavioral: Negative for suicidal ideas, behavioral problems, confusion and agitation. The patient is nervous/anxious.     Past Medical History  Diagnosis Date  . Pancreatitis     Pt states occured after one month of cephalexin. He was drinking alcohol very light and rare. But states preceding month he did not drink alchohol.    Social History   Social History  . Marital Status: Single    Spouse Name: N/A  . Number of Children: N/A  . Years of Education: N/A   Occupational History  . Not on file.   Social History Main Topics  . Smoking status: Never Smoker   . Smokeless tobacco: Not on file  . Alcohol Use: Yes     Comment: 2 beers every other week.  . Drug Use: No  . Sexual Activity: No   Other Topics Concern  . Not on file   Social History Narrative    No past surgical history on file.  No family history on file.  Allergies    Allergen Reactions  . Keflex [Cephalexin] Nausea And Vomiting    No current outpatient prescriptions on file prior to visit.   No current facility-administered medications on file prior to visit.    BP 118/62 mmHg  Pulse 88  Temp(Src) 97.9 F (36.6 C) (Oral)  Ht 5\' 7"  (1.702 m)  Wt 123 lb (55.792 kg)  BMI 19.26 kg/m2  SpO2 98%       Objective:   Physical Exam  General Mental Status- Alert. General Appearance- Not in acute distress.   Skin General: Color- Normal Color. Moisture- Normal Moisture.  Neck Carotid Arteries- Normal color. Moisture- Normal Moisture. No carotid bruits. No JVD.  Chest and Lung Exam Auscultation: Breath Sounds:-Normal.  Cardiovascular Auscultation:Rythm- Regular. Murmurs & Other Heart Sounds:Auscultation of the heart reveals- No Murmurs.  Abdomen Inspection:-Inspeection Normal. Palpation/Percussion:Note:No mass. Palpation and Percussion of the abdomen reveal- Non Tender, Non Distended + BS, no rebound or guarding.    Neurologic Cranial Nerve exam:- CN III-XII intact(No nystagmus), symmetric smile. Strength:- 5/5 equal and symmetric strength both upper and lower extremities.      Assessment & Plan:

## 2015-08-10 NOTE — Patient Instructions (Signed)
For your occasional anxiety and obsessive though I think you may benefit from rx of sertraline low dose.   You mentioned exercise helps so would encourage.  I will give you the rx of sertaline  so you can use. If you do decide to use rx then update me 2 wks after onset.

## 2015-08-10 NOTE — Progress Notes (Signed)
Pre visit review using our clinic review tool, if applicable. No additional management support is needed unless otherwise documented below in the visit note. 

## 2015-11-24 ENCOUNTER — Ambulatory Visit (INDEPENDENT_AMBULATORY_CARE_PROVIDER_SITE_OTHER): Payer: 59 | Admitting: Medical

## 2015-11-24 ENCOUNTER — Encounter: Payer: Self-pay | Admitting: Medical

## 2015-11-24 ENCOUNTER — Ambulatory Visit (HOSPITAL_BASED_OUTPATIENT_CLINIC_OR_DEPARTMENT_OTHER)
Admission: RE | Admit: 2015-11-24 | Discharge: 2015-11-24 | Disposition: A | Discharge status: Home / Self Care | Payer: 59 | Source: Ambulatory Visit | Attending: Medical | Admitting: Medical

## 2015-11-24 VITALS — BP 106/64 | HR 64 | Temp 97.9°F | Ht 67.0 in | Wt 122.6 lb

## 2015-11-24 DIAGNOSIS — K5900 Constipation, unspecified: Secondary | ICD-10-CM | POA: Diagnosis present

## 2015-11-24 NOTE — Progress Notes (Signed)
Subjective:    Patient ID: George ShileyJovany Kley, male    DOB: July 12, 1991, 25 y.o.   MRN: [id number]  HPI   Pt in for evaluation for recent constipation.  Pt states past month he states he is having some day when stools are small and hard. Other days he will have normal bowel movements. Pt states he has not been eating healthy. Pt is eating a lot of fried foods. Eats out about every other day. On weekends he eats out twice. Pt states before this past month would have bm about every day. In past stools typically softer and daily. Pt does exercise about 3 times a week. No red blood stools. Pt did try peptobismol the other day. Then stools had black stools. Also tried miralax. He had transient burn sensation left lower quadrant after miralax 3 wks ago. Then he had bowel movement after miralax.Then his pattern of intermittent small stools reoccured over past 3 wks.  Pt last bm was this am. This am bm was small.   Review of Systems  Constitutional: Negative for fever, chills and fatigue.  Respiratory: Negative for cough, chest tightness, shortness of breath and wheezing.   Cardiovascular: Negative for chest pain and palpitations.  Gastrointestinal: Positive for constipation. Negative for nausea, vomiting, diarrhea, blood in stool, abdominal distention, anal bleeding and rectal pain.       See hpi  Musculoskeletal: Negative for back pain and neck pain.  Skin: Negative for pallor and rash.  Neurological: Negative for dizziness and headaches.  Hematological: Negative for adenopathy. Does not bruise/bleed easily.  Psychiatric/Behavioral: Negative for behavioral problems and confusion.     Past Medical History  Diagnosis Date  . Pancreatitis     Pt states occured after one month of cephalexin. He was drinking alcohol very light and rare. But states preceding month he did not drink alchohol.    Social History   Social History  . Marital Status: Single    Spouse Name: N/A  . Number of Children:  N/A  . Years of Education: N/A   Occupational History  . Not on file.   Social History Main Topics  . Smoking status: Never Smoker   . Smokeless tobacco: Not on file  . Alcohol Use: Yes     Comment: 2 beers every other week.  . Drug Use: No  . Sexual Activity: No   Other Topics Concern  . Not on file   Social History Narrative    No past surgical history on file.  No family history on file.  Allergies  Allergen Reactions  . Keflex [Cephalexin] Nausea And Vomiting    Current Outpatient Prescriptions on File Prior to Visit  Medication Sig Dispense Refill  . sertraline (ZOLOFT) 25 MG tablet Take 1 tablet (25 mg total) by mouth daily. (Patient not taking: Reported on 11/24/2015) 30 tablet 0   No current facility-administered medications on file prior to visit.    BP 106/64 mmHg  Pulse 64  Temp(Src) 97.9 F (36.6 C) (Oral)  Ht 5\' 7"  (1.702 m)  Wt 122 lb 9.6 oz (55.611 kg)  BMI 19.20 kg/m2  SpO2 99%       Objective:   Physical Exam  General Appearance- Not in acute distress.  HEENT Eyes- Scleraeral/Conjuntiva-bilat- Not Yellow. Mouth & Throat- Normal.  Chest and Lung Exam Auscultation: Breath sounds:-Normal. Adventitious sounds:- No Adventitious sounds.  Cardiovascular Auscultation:Rythm - Regular. Heart Sounds -Normal heart sounds.  Abdomen Inspection:-Inspection Normal.  Palpation/Perucssion: Palpation and Percussion of the abdomen  reveal- Non Tender, No Rebound tenderness, No rigidity(Guarding) and No Palpable abdominal masses.  Liver:-Normal.  Spleen:- Normal.    Back- no cva tenderness.     Assessment & Plan:  Also educated pt not to use pepto bismal. Can create black appearance to stool. If any black appearance to stool and not using pepto let us know. Pt agreed.

## 2015-11-24 NOTE — Progress Notes (Signed)
Pre visit review using our clinic review tool, if applicable. No additional management support is needed unless otherwise documented below in the visit note. 

## 2015-11-24 NOTE — Patient Instructions (Signed)
Your constipation appears to be diet related. I want you to try to eliminate fried foods. Eat less process foods and more fruits and vegetables(hydrate well and exercise). Will order abd xray today to assess stool volume. You could use probioitc otc as your asked. If no bm after 2 consecutive days could use miralax or dulcolax. We will follow your xray and I want you to follow up in 2 wks. If your have 3 consecutive days with no bm despite above measures then notify us.  Constipation, Adult Constipation is when a person has fewer than three bowel movements a week, has difficulty having a bowel movement, or has stools that are dry, hard, or larger than normal. As people grow older, constipation is more common. A low-fiber diet, not taking in enough fluids, and taking certain medicines may make constipation worse.  CAUSES   Certain medicines, such as antidepressants, pain medicine, iron supplements, antacids, and water pills.   Certain diseases, such as diabetes, irritable bowel syndrome (IBS), thyroid disease, or depression.   Not drinking enough water.   Not eating enough fiber-rich foods.   Stress or travel.   Lack of physical activity or exercise.   Ignoring the urge to have a bowel movement.   Using laxatives too much.  SIGNS AND SYMPTOMS   Having fewer than three bowel movements a week.   Straining to have a bowel movement.   Having stools that are hard, dry, or larger than normal.   Feeling full or bloated.   Pain in the lower abdomen.   Not feeling relief after having a bowel movement.  DIAGNOSIS  Your health care provider will take a medical history and perform a physical exam. Further testing may be done for severe constipation. Some tests may include:  A barium enema X-ray to examine your rectum, colon, and, sometimes, your small intestine.   A sigmoidoscopy to examine your lower colon.   A colonoscopy to examine your entire colon. TREATMENT   Treatment will depend on the severity of your constipation and what is causing it. Some dietary treatments include drinking more fluids and eating more fiber-rich foods. Lifestyle treatments may include regular exercise. If these diet and lifestyle recommendations do not help, your health care provider may recommend taking over-the-counter laxative medicines to help you have bowel movements. Prescription medicines may be prescribed if over-the-counter medicines do not work.  HOME CARE INSTRUCTIONS   Eat foods that have a lot of fiber, such as fruits, vegetables, whole grains, and beans.  Limit foods high in fat and processed sugars, such as french fries, hamburgers, cookies, candies, and soda.   A fiber supplement may be added to your diet if you cannot get enough fiber from foods.   Drink enough fluids to keep your urine clear or pale yellow.   Exercise regularly or as directed by your health care provider.   Go to the restroom when you have the urge to go. Do not hold it.   Only take over-the-counter or prescription medicines as directed by your health care provider. Do not take other medicines for constipation without talking to your health care provider first.  SEEK IMMEDIATE MEDICAL CARE IF:   You have bright red blood in your stool.   Your constipation lasts for more than 4 days or gets worse.   You have abdominal or rectal pain.   You have thin, pencil-like stools.   You have unexplained weight loss. MAKE SURE YOU:   Understand these instructions.  Will  watch your condition.  Will get help right away if you are not doing well or get worse.   This information is not intended to replace advice given to you by your health care provider. Make sure you discuss any questions you have with your health care provider.   Document Released: 06/01/2004 Document Revised: 09/24/2014 Document Reviewed: 06/15/2013 Elsevier Interactive Patient Education [employer].

## 2016-04-11 ENCOUNTER — Ambulatory Visit (INDEPENDENT_AMBULATORY_CARE_PROVIDER_SITE_OTHER): Payer: 59 | Admitting: Medical

## 2016-04-11 ENCOUNTER — Encounter: Payer: Self-pay | Admitting: Medical

## 2016-04-11 VITALS — BP 110/70 | HR 77 | Temp 98.0°F | Ht 67.0 in | Wt 124.2 lb

## 2016-04-11 DIAGNOSIS — R0789 Other chest pain: Secondary | ICD-10-CM | POA: Diagnosis not present

## 2016-04-11 DIAGNOSIS — S46812A Strain of other muscles, fascia and tendons at shoulder and upper arm level, left arm, initial encounter: Secondary | ICD-10-CM | POA: Diagnosis not present

## 2016-04-11 DIAGNOSIS — R6884 Jaw pain: Secondary | ICD-10-CM | POA: Diagnosis not present

## 2016-04-11 MED ORDER — CYCLOBENZAPRINE HCL 10 MG PO TABS: 10.0000 mg | ORAL_TABLET | Freq: Every day | ORAL | 0 refills | Status: DC

## 2016-04-11 MED ORDER — DOXYCYCLINE HYCLATE 100 MG PO TABS: 100.0000 mg | ORAL_TABLET | Freq: Two times a day (BID) | ORAL | 0 refills | Status: DC

## 2016-04-11 MED ORDER — DICLOFENAC SODIUM 75 MG PO TBEC: 75.0000 mg | DELAYED_RELEASE_TABLET | Freq: Two times a day (BID) | ORAL | 0 refills | Status: DC

## 2016-04-11 NOTE — Progress Notes (Signed)
Subjective:    Patient ID: George Klein, male    DOB: 08/24/1991, 25 y.o.   MRN: [id number]  HPI  Pt in for evaluation.   Pt in states he had discomfort sensation in his chest pain a week ago sunday. He states 2 days ago the discomfort went away. When he had discomfort he had no no pain in chest on movement. No sob related and no cough.    Pt states recently also some trapezius and left side neck area pain that has hurt intermittently last 10 days. Some now and some when his chest hurt. Pt has been doing a lot heavy lifting at work but also states some work on his car. Also some work with [employer]. On review no work injury.  In addition he has some pain left jaw area on Monday. Pt has not seen dentist for about one year.  Review of Systems  Constitutional: Negative for chills, fatigue and fever.  Respiratory: Negative for cough, chest tightness, shortness of breath and wheezing.   Cardiovascular: Positive for chest pain. Negative for palpitations.       None now but some for about a week up until 2 days ago.  Gastrointestinal: Negative for abdominal pain.  Musculoskeletal: Positive for neck pain. Negative for arthralgias, back pain, joint swelling and neck stiffness.       Left trapezius  Skin: Negative for rash.   Past Medical History:  Diagnosis Date  . Pancreatitis    Pt states occured after one month of cephalexin. He was drinking alcohol very light and rare. But states preceding month he did not drink alchohol.     Social History   Social History  . Marital status: Single    Spouse name: N/A  . Number of children: N/A  . Years of education: N/A   Occupational History  . Not on file.   Social History Main Topics  . Smoking status: Never Smoker  . Smokeless tobacco: Not on file  . Alcohol use Yes     Comment: 2 beers every other week.  . Drug use: No  . Sexual activity: No   Other Topics Concern  . Not on file   Social History Narrative  . No narrative  on file    No past surgical history on file.  No family history on file.  Allergies  Allergen Reactions  . Keflex [Cephalexin] Nausea And Vomiting    No current outpatient prescriptions on file prior to visit.   No current facility-administered medications on file prior to visit.     BP 110/70 (BP Location: Right Arm, Patient Position: Sitting, Cuff Size: Normal)   Pulse 77   Temp 98 F (36.7 C) (Oral)   Ht 5\' 7"  (1.702 m)   Wt 124 lb 3.2 oz (56.3 kg)   SpO2 98%   BMI 19.45 kg/m       Objective:   Physical Exam  General  Mental Status - Alert. General Appearance - Well groomed. Not in acute distress.  Skin Rashes- No Rashes.  HEENT Head- Normal. Left lower jaw faint tender to palpation angle of mandible. Parotid gland does not feel swollen.(moderate poor dention left side lower teeth) Eye Sclera/Conjunctiva- Left- Normal. Right- Normal. Nose & Sinuses Nasal Mucosa- Left-   Not Boggy and Congested. Right-  Not  Boggy and  Congested.Bilateral maxillary and frontal sinus pressure. Mouth & Throat Lips: Upper Lip- Normal: no dryness, cracking, pallor, cyanosis, or vesicular eruption. Lower Lip-Normal: no dryness, cracking,  pallor, cyanosis or vesicular eruption. Buccal Mucosa- Bilateral- No Aphthous ulcers. Oropharynx- No Discharge or Erythema. Tonsils: Characteristics- Bilateral- No Erythema or Congestion. Size/Enlargement- Bilateral- No enlargement. Discharge- bilateral-None.  Neck Neck- Supple. No Masses. No mid cspine pain. Left trapezius tenderness to palpation.   Chest and Lung Exam Auscultation: Breath Sounds:-Clear even and unlabored.  Cardiovascular Auscultation:Rythm- Regular, rate and rhythm. Murmurs & Other Heart Sounds:Ausculatation of the heart reveal- No Murmurs.  Lymphatic Head & Neck General Head & Neck Lymphatics: Bilateral: Description- No Localized lymphadenopathy.       Assessment & Plan:  Your ekg looked normal. You don't have  chest  pain presently. It is possible pain was just muscular related to activity. If your pain return would advise evaluation that day. If any severe type pain then ED evaluation.  For current left trapezius strain/pain. Rx diclofenac and cyclobenzaprine.  For jaw pain and potential tooth infection rx doxycycline(pt allergy history limits choices). Also recommended seeing dentist.  Follow up in 7-10 days or as needed  Pavan Bring, Ramon Dredge, [employer]

## 2016-04-11 NOTE — Patient Instructions (Addendum)
Your ekg looked normal. You don't have chest  pain presently. It is possible pain was just muscular related to activity. If your pain return would advise evaluation that day. If any severe type pain then ED evaluation.  For current left trapezius strain/pain. Rx diclofenac and cyclobenzaprine.  For jaw pain and potential tooth infection rx doxycycline. Also would recommend seeing dentis.  Follow up in 7-10 days or as needed

## 2016-04-11 NOTE — Progress Notes (Signed)
Pre visit review using our clinic review tool, if applicable. No additional management support is needed unless otherwise documented below in the visit note. 

## 2016-09-18 ENCOUNTER — Ambulatory Visit: Payer: 59 | Admitting: Physician Assistant

## 2016-09-20 ENCOUNTER — Ambulatory Visit (INDEPENDENT_AMBULATORY_CARE_PROVIDER_SITE_OTHER): Payer: 59

## 2016-09-20 ENCOUNTER — Encounter: Payer: Self-pay | Admitting: Physician Assistant

## 2016-09-20 ENCOUNTER — Ambulatory Visit (INDEPENDENT_AMBULATORY_CARE_PROVIDER_SITE_OTHER): Payer: 59 | Admitting: Physician Assistant

## 2016-09-20 VITALS — BP 119/74 | HR 75 | Ht 67.0 in | Wt 125.0 lb

## 2016-09-20 DIAGNOSIS — R109 Unspecified abdominal pain: Secondary | ICD-10-CM | POA: Diagnosis not present

## 2016-09-20 DIAGNOSIS — K219 Gastro-esophageal reflux disease without esophagitis: Secondary | ICD-10-CM

## 2016-09-20 DIAGNOSIS — Z8719 Personal history of other diseases of the digestive system: Secondary | ICD-10-CM

## 2016-09-20 DIAGNOSIS — Z0000 Encounter for general adult medical examination without abnormal findings: Secondary | ICD-10-CM

## 2016-09-20 DIAGNOSIS — R1013 Epigastric pain: Secondary | ICD-10-CM

## 2016-09-20 DIAGNOSIS — R300 Dysuria: Secondary | ICD-10-CM | POA: Diagnosis not present

## 2016-09-20 LAB — POCT URINALYSIS DIPSTICK
BILIRUBIN UA: NEGATIVE
GLUCOSE UA: NEGATIVE
NITRITE UA: NEGATIVE
Spec Grav, UA: 1.025
Urobilinogen, UA: 0.2
pH, UA: 6.5

## 2016-09-20 NOTE — Patient Instructions (Addendum)
   I have ordered blood work for you. Please go to [facility]: [id number], [place], [place] [id number] [phone]. Bring your lab orders with you.  We will contact you with the results of your tests today  Please go downstairs to imaging for an Xray of your abdomen  I recommend no alcohol    Food Choices for Gastroesophageal Reflux Disease, Adult When you have gastroesophageal reflux disease (GERD), the foods you eat and your eating habits are very important. Choosing the right foods can help ease your discomfort. What guidelines do I need to follow?  Choose fruits, vegetables, whole grains, and low-fat dairy products.  Choose low-fat meat, fish, and poultry.  Limit fats such as oils, salad dressings, butter, nuts, and avocado.  Keep a food diary. This helps you identify foods that cause symptoms.  Avoid foods that cause symptoms. These may be different for everyone.  Eat small meals often instead of 3 large meals a day.  Eat your meals slowly, in a place where you are relaxed.  Limit fried foods.  Cook foods using methods other than frying.  Avoid drinking alcohol.  Avoid drinking large amounts of liquids with your meals.  Avoid bending over or lying down until 2-3 hours after eating. What foods are not recommended? These are some foods and drinks that may make your symptoms worse: Vegetables  Tomatoes. Tomato juice. Tomato and spaghetti sauce. Chili peppers. Onion and garlic. Horseradish. Fruits  Oranges, grapefruit, and lemon (fruit and juice). Meats  High-fat meats, fish, and poultry. This includes hot dogs, ribs, ham, sausage, salami, and bacon. Dairy  Whole milk and chocolate milk. Sour cream. Cream. Butter. Ice cream. Cream cheese. Drinks  Coffee and tea. Bubbly (carbonated) drinks or energy drinks. Condiments  Hot sauce. Barbecue sauce. Sweets/Desserts  Chocolate and cocoa. Donuts. Peppermint and spearmint. Fats and Oils  High-fat foods.  This includes [place] fries and potato chips. Other  Vinegar. Strong spices. This includes black pepper, white pepper, red pepper, cayenne, curry powder, cloves, ginger, and chili powder. The items listed above may not be a complete list of foods and drinks to avoid. Contact your dietitian for more information.  This information is not intended to replace advice given to you by your health care provider. Make sure you discuss any questions you have with your health care provider. Document Released: 03/04/2012 Document Revised: 02/09/2016 Document Reviewed: 07/08/2013 Elsevier Interactive Patient Education  2017 [employer]

## 2016-09-20 NOTE — Progress Notes (Signed)
HPI:                                                                George Klein is a 26 y.o. male who presents to [facility]: Primary [facility] today to establish care and c/o dysuria  Dysuria: patient has a history of dysuria and chlamydia urethritis (2016). Reports pain only with first void in the morning. Endorses foul odor.  Currently sexually active, 1 male partner, and sometimes using condoms. Denies painful erections. Denies penile discharge or lesions. Denies testicular pain.  Hx of pancreatitis: states he has had left-sided flank pain intermittently x 3 months. Describes a burning pain in his LUQ/side that radiates to his left shoulder blade. He notices it is much worse when he drinks alcohol. Endorses 5-6 beers on the weekend. Occasionally 1 beer during the week.  Hx of anxiety: patient reports that he is doing much better. Stress was mostly related to school and work. He has started a new job and feels this has made a difference.  Reflux: takes Tums as needed.   Health Maintenance Health Maintenance  Topic Date Due  . HIV Screening  04/06/2008  . TETANUS/TDAP  04/06/2012  . INFLUENZA VACCINE  07/17/2017 (Originally 04/17/2016)   Declined influenza vaccine today  Health Habits  Diet: "poor," fast food (works third shift)  Exercise: no  ETOH: yes  Tobacco: no, never  Drugs: marijuana  Dental Exam: no  Eye Exam: wears glasses, has not had an eye exam recently  Past Medical History:  Diagnosis Date  . Adjustment disorder 2016  . Allergy   . Anxiety   . GERD (gastroesophageal reflux disease)   . Heartburn   . Pancreatitis    Pt states occured after one month of cephalexin. He was drinking alcohol very light and rare. But states preceding month he did not drink alchohol.   History reviewed. No pertinent surgical history. Social History  Substance Use Topics  . Smoking status: Never Smoker  . Smokeless tobacco: Never Used  .  Alcohol use 4.2 oz/week    7 Cans of beer per week   family history includes Diabetes in his maternal grandmother; Hyperlipidemia in his father.  Review of Systems  Constitutional: Negative for chills, fever and malaise/fatigue.  HENT: Negative.   Eyes: Positive for blurred vision (wears corrective lenses).  Respiratory: Negative.   Cardiovascular: Negative.   Gastrointestinal: Positive for abdominal pain (luq) and heartburn. Negative for blood in stool, diarrhea, nausea and vomiting.  Genitourinary: Positive for dysuria. Flank pain: left-sided.  Skin: Negative for rash.  Neurological: Negative.   Psychiatric/Behavioral: Negative for depression. The patient is not nervous/anxious.     Medications: No current outpatient prescriptions on file.   No current facility-administered medications for this visit.    Allergies  Allergen Reactions  . Keflex [Cephalexin] Nausea And Vomiting    Objective:  BP 119/74   Pulse 75   Ht 5\' 7"  (1.702 m)   Wt 125 lb (56.7 kg)   BMI 19.58 kg/m  Gen: well-groomed, cooperative, not ill-appearing, no distress HEENT: normal conjunctiva, TM's clear, oropharynx clear, multiple dental caries, moist mucus membranes, no thyromegaly or tenderness Lungs: Normal work of breathing, clear to auscultation bilaterally Heart: Normal rate, regular rhythm, s1  and s2 distinct, no murmurs, clicks or rubs appreciated on this exam, no carotid bruit Abd: Soft. Nondistended, Mildly tender over the left epigastrium and left upper quadrant, no rebound or guarding Neuro: alert and oriented x 3, EOM's intact, PERRLA, DTR's intact MSK: strength 5/5 and symmetric, normal gait Skin: warm and dry, no rashes or lesions on exposed skin Psych: normal affect, pleasant mood, normal speech and thought content   Results for orders placed or performed in visit on 09/20/16 (from the past 72 hour(s))  POCT Urinalysis Dipstick     Status: Abnormal   Collection Time: 09/20/16  2:25 PM   Result Value Ref Range   Color, UA orange    Clarity, UA slightly cloudy    Glucose, UA negative    Bilirubin, UA negative    Ketones, UA trace    Spec Grav, UA 1.025    Blood, UA trace    pH, UA 6.5    Protein, UA trace    Urobilinogen, UA 0.2    Nitrite, UA negative    Leukocytes, UA moderate (2+) (A) Negative   Dg Abd 2 Views  Result Date: 09/20/2016 CLINICAL DATA:  Three-month history of abdominal pain. History of pancreatitis EXAM: ABDOMEN - 2 VIEW COMPARISON:  November 24, 2015 FINDINGS: Supine and upright images were obtained. There is moderate stool throughout the colon. There is no bowel dilatation or air-fluid levels suggesting bowel obstruction. No free air. There are small phleboliths in the pelvis. Lung bases are clear. IMPRESSION: No bowel obstruction or free air. Moderate stool in colon. Lung bases clear. Electronically Signed   By: Bretta BangWilliam  Woodruff III M.D.   On: 09/20/2016 14:50      Assessment and Plan: 26 y.o. male with   1. Epigastric pain - Peptic or duodenal ulcer are on my differential given patient's alcohol use and hx of pancreatitis. DG Abd 2 Views negative for free air / perforation - Amylase & Lipase pending - H. Pylori breath test pending  2. Encounter for preventive health examination - CBC - Comprehensive metabolic panel - Hemoglobin A1c - HIV antibody - Lipid panel - TSH  3. Dysuria - high suspicion that dysuria is due to gonococcal or chlamydial urethritis - POCT Urinalysis Dipstick positive for trace protein, trace ketones, trace blood, and moderate leuks - Urine culture pending - GC/Chlamydia Probe Amp pending - DG Abd 2 Views did not show renal stones - did not treat empirically today because UA was not resulted until after the patient was discharged, but plan to send antibiotic as soon as GC/Chlamydia probe results  Patient education and anticipatory guidance given Patient agrees with treatment plan Follow-up  or sooner as  needed  Levonne Hubertharley E. Tangee Marszalek PA-C

## 2016-09-21 ENCOUNTER — Other Ambulatory Visit: Payer: Self-pay | Admitting: Physician Assistant

## 2016-09-21 DIAGNOSIS — A5601 Chlamydial cystitis and urethritis: Secondary | ICD-10-CM

## 2016-09-21 LAB — GC/CHLAMYDIA PROBE AMP
CT Probe RNA: DETECTED — AB
GC PROBE AMP APTIMA: NOT DETECTED

## 2016-09-21 LAB — H. PYLORI BREATH TEST: H. pylori Breath Test: NOT DETECTED

## 2016-09-21 MED ORDER — AZITHROMYCIN 500 MG PO TABS: 1000.0000 mg | ORAL_TABLET | Freq: Once | ORAL | 0 refills | Status: AC

## 2016-09-21 NOTE — Progress Notes (Signed)
Patient's urine tested positive for Chlamydia. Sent 1g Azithromycin to his pharmacy. Instructed him to return for test for clearance in 3 weeks.   Levonne Hubertharley E. Sascha Baugher PA-C

## 2016-09-22 LAB — URINE CULTURE: Organism ID, Bacteria: NO GROWTH

## 2016-09-24 ENCOUNTER — Encounter: Payer: Self-pay | Admitting: Physician Assistant

## 2016-09-24 LAB — CBC AND DIFFERENTIAL
HEMATOCRIT: 44 % (ref 41–53)
HEMOGLOBIN: 15.2 g/dL (ref 13.5–17.5)
PLATELETS: 298 10*3/uL (ref 150–399)
WBC: 5.1 10^3/mL

## 2016-09-24 LAB — BASIC METABOLIC PANEL
BUN: 15 mg/dL (ref 4–21)
CREATININE: 0.8 mg/dL (ref <=1.3)
Glucose: 98 mg/dL
Potassium: 4.8 mmol/L (ref 3.4–5.3)
Sodium: 142 mmol/L (ref 137–147)

## 2016-09-24 LAB — HEPATIC FUNCTION PANEL
ALK PHOS: 86 U/L (ref 25–125)
ALT: 16 U/L (ref 10–40)
AST: 14 U/L (ref 14–40)
Bilirubin, Total: 0.8 mg/dL

## 2016-09-24 LAB — TSH: TSH: 0.92 u[IU]/mL (ref 0.41–5.90)

## 2016-09-24 LAB — LIPID PANEL
CHOLESTEROL: 131 mg/dL (ref 0–200)
HDL: 51 mg/dL (ref 35–70)
LDL CALC: 66 mg/dL
LDl/HDL Ratio: 2.6
TRIGLYCERIDES: 71 mg/dL (ref 40–160)

## 2016-09-24 LAB — HEMOGLOBIN A1C: Hemoglobin A1C: 4.9

## 2016-09-24 LAB — HIV ANTIBODY (ROUTINE TESTING W REFLEX): HIV: NONREACTIVE

## 2016-11-02 ENCOUNTER — Ambulatory Visit: Payer: 59 | Admitting: Physician Assistant

## 2016-11-05 ENCOUNTER — Ambulatory Visit (INDEPENDENT_AMBULATORY_CARE_PROVIDER_SITE_OTHER): Payer: 59 | Admitting: Physician Assistant

## 2016-11-05 VITALS — BP 113/75 | HR 69 | Wt 123.0 lb

## 2016-11-05 DIAGNOSIS — R1012 Left upper quadrant pain: Secondary | ICD-10-CM | POA: Diagnosis not present

## 2016-11-05 DIAGNOSIS — A5601 Chlamydial cystitis and urethritis: Secondary | ICD-10-CM | POA: Diagnosis not present

## 2016-11-05 MED ORDER — RANITIDINE HCL 300 MG PO TABS: 150.0000 mg | ORAL_TABLET | Freq: Two times a day (BID) | ORAL | 3 refills | Status: DC

## 2016-11-05 NOTE — Patient Instructions (Addendum)
Avoid over-the-counter NSAIDs (ibuprofen, naproxen, etc.). Tylenol/acetaminophen is okay Avoid the below foods Follow the DASH eating plan Take Ranitidine 1/2 or full tab twice a day before meals You will be contacted to schedule your abdominal ultrasound Follow-up in 3 months  Food Choices for Peptic Ulcer Disease When you have peptic ulcer disease, the foods you eat and your eating habits are very important. Choosing the right foods can help ease the discomfort of peptic ulcer disease. What general guidelines do I need to follow?  Choose fruits, vegetables, whole grains, and low-fat meat, fish, and poultry.  Keep a food diary to identify foods that cause symptoms.  Avoid foods that cause irritation or pain. These may be different for different people.  Eat frequent small meals instead of three large meals each day. The pain may be worse when your stomach is empty.  Avoid eating close to bedtime. What foods are not recommended? The following are some foods and drinks that may worsen your symptoms:  Black, white, and red pepper.  Hot sauce.  Chili peppers.  Chili powder.  Chocolate and cocoa.  Alcohol.  Tea, coffee, and cola (regular and decaffeinated). The items listed above may not be a complete list of foods and beverages to avoid. Contact your dietitian for more information.  This information is not intended to replace advice given to you by your health care provider. Make sure you discuss any questions you have with your health care provider. Document Released: 11/26/2011 Document Revised: 02/09/2016 Document Reviewed: 07/08/2013 Elsevier Interactive Patient Education  2017 [employer]

## 2016-11-05 NOTE — Progress Notes (Signed)
HPI:                                                                George Klein is a 26 y.o. male who presents to [place]: [place] today for abdominal pain  Patient with remote history of pancreatitis reports LUQ discomfort x 6 months. Worse with sugary drinks such as sodas and sweet tea. He reports diet is poor and he eats fast food daily. Rates the pain 6/10. Lasts 2-3 hours. He has not taken anything over the counter for this. Reports he is having more frequent bowel movements, 1-2 times per day. Reports stools are tan and normal consistency. He is unsure if there has been blood in his stool. Denies dark, tarry stools. Denies fever, chills, weight loss. He had a negative H. Pylori breath test 09/20/16 CMP wnl 09/25/16 Amylase, lipase wnl 09/25/16  Patient recently completed Azithromycin therapy for chlamydial urethritis diagnosed 09/20/2016. States partner was also treated. He would like to be tested for cure. He denies dysuria, urethral discharge, or testicular pain today.  Past Medical History:  Diagnosis Date  . Adjustment disorder 2016  . Allergy   . Anxiety   . GERD (gastroesophageal reflux disease)   . Heartburn   . Pancreatitis    Pt states occured after one month of cephalexin. He was drinking alcohol very light and rare. But states preceding month he did not drink alchohol.   No past surgical history on file. Social History  Substance Use Topics  . Smoking status: Never Smoker  . Smokeless tobacco: Never Used  . Alcohol use 4.2 oz/week    7 Cans of beer per week   family history includes Diabetes in his maternal grandmother; Hyperlipidemia in his father.  ROS: negative except as noted in the HPI  Medications: Current Outpatient Prescriptions  Medication Sig Dispense Refill  . ranitidine (ZANTAC) 300 MG tablet Take 0.5 tablets (150 mg total) by mouth 2 (two) times daily. 180 tablet 3   No current facility-administered  medications for this visit.    Allergies  Allergen Reactions  . Keflex [Cephalexin] Nausea And Vomiting       Objective:  BP 113/75   Pulse 69   Wt 123 lb (55.8 kg)   BMI 19.26 kg/m  Gen: well-groomed, cooperative, not ill-appearing, no distress Pulm: Normal work of breathing, normal phonation, clear to auscultation bilaterally, no wheezes, rales or rhonchi CV: Normal rate, regular rhythm, s1 and s2 distinct, no murmurs, clicks or rubs  GI: normal appearing, normal bowel sounds, soft, nondistended, negative Murphy's sign, tender in the left upper quadrant, no rebound, no guarding, no hepatosplenomegaly Neuro: alert and oriented x 3, EOM's intact Skin: warm and dry, no rashes or lesions on exposed skin, no cyanosis  Recent Labs 09/25/2016 CMP Ca 10 Na 142 K 4.8 BUN 15 Scr 0.82 Alk Phos 86 AST 14 ALT 16  CBC WBC 5.6 RBC 5.05 Hgb 15.2 Hct 44.3 MCV 88  HgbA1c 4.9  Amylase 82 Lipase 28  LIPIDS TC 131 TG 71 HDL 51 LDL 66 PLT 298   Assessment and Plan: 26 y.o. male with   1. 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 LUQ pain - mildly tender, otherwise benign abdomen. Normal laboratory work-up and negative H. Pylori.  - conservative management with elimination diet, including limiting fried and fast food - instructed to avoid NSAIDs and alcohol - trialing Zantac 137m bid, may titrate to 3042m- USKoreabdomen Complete; Future - ranitidine (ZANTAC) 300 MG tablet; Take 0.5 tablets (150 mg total) by mouth 2 (two) times daily.  Dispense: 180 tablet; Refill: 3 - if no improvement in 3 months, will refer to GI  Patient education and anticipatory guidance given Patient agrees with treatment plan Follow-up in 3 months or sooner as needed  ChDarlyne RussianA-C

## 2016-11-06 LAB — GC/CHLAMYDIA PROBE AMP
CT PROBE, AMP APTIMA: DETECTED — AB
GC PROBE AMP APTIMA: NOT DETECTED

## 2016-11-07 ENCOUNTER — Telehealth: Payer: Self-pay

## 2016-11-07 NOTE — Telephone Encounter (Signed)
-----   Message from [facility] StarCharley Elizabeth Cummings, [place]PA-C sent at 11/07/2016  8:38 AM EST ----- Patient has chlamydia. Bring patient back for observed Azithromycin 1gram PO.

## 2016-11-07 NOTE — Telephone Encounter (Signed)
Left vm for pt to return call to clinic  

## 2016-11-08 ENCOUNTER — Encounter: Payer: Self-pay | Admitting: Physician Assistant

## 2016-11-12 ENCOUNTER — Encounter: Payer: Self-pay | Admitting: Physician Assistant

## 2016-12-18 ENCOUNTER — Telehealth: Payer: Self-pay | Admitting: Physician Assistant

## 2016-12-18 NOTE — Telephone Encounter (Signed)
Patient Name: JAVEN HINDERLITER  DOB: [birth year]    Initial Comment Caller has blood in his stool.    Nurse Assessment  Nurse: Scarlette Ar, RN, Heather Date/Time (Eastern Time): 12/18/2016 3:27:24 PM  Confirm and document reason for call. If symptomatic, describe symptoms. ---Caller states that he has had blood in his stool since Sunday, today it was black.  Does the patient have any new or worsening symptoms? ---Yes  Will a triage be completed? ---Yes  Related visit to physician within the last 2 weeks? ---No  Does the PT have any chronic conditions? (i.e. diabetes, asthma, etc.) ---Yes  List chronic conditions. ---See MR  Is this a behavioral health or substance abuse call? ---No     Guidelines    Guideline Title Affirmed Question Affirmed Notes  Rectal Bleeding Tarry or jet black-colored stool (not dark green)    Final Disposition User   Go to ED Now Standifer, RN, Heather    Referrals  GO TO FACILITY UNDECIDED   Disagree/Comply: Comply

## 2016-12-18 NOTE — Telephone Encounter (Signed)
Patient scheduled an appointment on My Chart to come in on Friday to see Ramon Dredge because he is having blood in his stool. Contacted patient and explained that because of what he is experiencing he needed to speak with a Charity fundraiser. Transferred patient to [facility].

## 2016-12-21 ENCOUNTER — Ambulatory Visit: Payer: Self-pay | Admitting: Medical

## 2017-02-04 ENCOUNTER — Ambulatory Visit (INDEPENDENT_AMBULATORY_CARE_PROVIDER_SITE_OTHER): Payer: 59 | Admitting: Physician Assistant

## 2017-02-04 ENCOUNTER — Encounter: Payer: Self-pay | Admitting: Physician Assistant

## 2017-02-04 VITALS — BP 114/74 | HR 70 | Temp 98.4°F | Wt 118.0 lb

## 2017-02-04 DIAGNOSIS — A5601 Chlamydial cystitis and urethritis: Secondary | ICD-10-CM

## 2017-02-04 DIAGNOSIS — Z789 Other specified health status: Secondary | ICD-10-CM

## 2017-02-04 DIAGNOSIS — Z09 Encounter for follow-up examination after completed treatment for conditions other than malignant neoplasm: Secondary | ICD-10-CM

## 2017-02-04 DIAGNOSIS — K921 Melena: Secondary | ICD-10-CM | POA: Diagnosis not present

## 2017-02-04 MED ORDER — AZITHROMYCIN 250 MG PO TABS: 1000.0000 mg | ORAL_TABLET | Freq: Once | ORAL | Status: DC

## 2017-02-04 MED ORDER — AZITHROMYCIN 250 MG PO TABS: 1000.0000 mg | ORAL_TABLET | Freq: Every day | ORAL | Status: DC

## 2017-02-04 MED ORDER — AZITHROMYCIN 250 MG PO TABS
1000.0000 mg | ORAL_TABLET | Freq: Every day | ORAL | Status: DC
  Administered 2017-02-04: 1000 mg via ORAL

## 2017-02-04 NOTE — Progress Notes (Signed)
HPI:                                                                George Klein is a 26 y.o. male who presents to [place]: [place] today for hospital follow-up  Patient with PMH of acute pancreatitis and GERD was seen at [facility] on 12/18/16 for bright red blood in his stool and melena. He reported drinking more alcoholic beverages and having some dyspepsia. His vitals were stable and labs were wnl, including Hgb (14). He was placed on Omeprazole 11m and referred to GI for a possible EGD. Patient did not follow-up with GI. He reports he changed his diet and is not drinking as much alcohol. He is not taking Omeprazole and states symptoms have resolved. He denies hematochezia, melena or BRBPR.  Additionally, patient tested positive for Chlamydia urethritis on test of cure in February and was lost to follow-up. He denies urethral discharge or dysuria.   Past Medical History:  Diagnosis Date  . Adjustment disorder 2016  . Allergy   . Anxiety   . GERD (gastroesophageal reflux disease)   . Heartburn   . Pancreatitis    Pt states occured after one month of cephalexin. He was drinking alcohol very light and rare. But states preceding month he did not drink alchohol.   No past surgical history on file. Social History  Substance Use Topics  . Smoking status: Never Smoker  . Smokeless tobacco: Never Used  . Alcohol use Yes   family history includes Diabetes in his maternal grandmother; Hyperlipidemia in his father.  ROS: negative except as noted in the HPI  Medications: No current outpatient prescriptions on file.   No current facility-administered medications for this visit.    Allergies  Allergen Reactions  . Keflex [Cephalexin] Nausea And Vomiting       Objective:  BP 114/74   Pulse 70   Temp 98.4 F (36.9 C) (Oral)   Wt 118 lb (53.5 kg)   BMI 18.48 kg/m  Gen: well-groomed, cooperative, not ill-appearing,  no distress HEENT: normal conjunctiva, anicteric sclera, oropharynx clear, moist mucus membranes, neck supple, trachea midline Pulm: Normal work of breathing, normal phonation, clear to auscultation bilaterally, no wheezes, rales or rhonchi CV: Normal rate, regular rhythm, s1 and s2 distinct, no murmurs, clicks or rubs  GI: abdomen normal appearence, soft, nondistended, nontender, no organomegaly, no masses GU: deferred Neuro: alert and oriented x 3, EOM's intact, no tremor MSK: moving all extremities, normal gait and station, no peripheral edema Skin: warm, dry, intact; no rashes or lesions on exposed skin, no jaundice  CBC 12/18/2016 WBC 4.3 RBC 4.51 MCV 88 Hgb 14 Hct 39.5 Plt 257  CMP 12/18/2016 Na 140 K 4.3 Cl 101 CO2 30 Ca 9.4 Glucose 98 BUN 9 Scr 0.64 Albumin 4.5 Alk Phos 83 T Bili 0.74 AST 16 ALT 14  No results found for this or any previous visit (from the past 72 hour(s)). No results found.    Assessment and Plan: 26y.o. male with   1. Hospital discharge follow-up - reviewed patient's labs, which were unremarkable  2. 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 Instructed to return in 2 weeks for test of  cure. Instructed to inform sexual partners and use condoms until partners have been treated and have had a negative test of cure - azithromycin (ZITHROMAX) tablet 1,000 mg; Take 4 tablets (1,000 mg total) by mouth daily.  3. Melena - given history of acute pancreatitis, recurrent symptoms of GERD/epigastric pain and history of heavy beer drinking, recommended patient follow-up with GI - liver enzymes and Alk phos wnl - Continue to take daily probiotic - Take Zantac 18m as needed for heartburn - Limit alcohol to no more than 2 drinks/day - Ambulatory referral to Gastroenterology   Patient education and anticipatory guidance given Patient agrees with treatment plan Follow-up as needed if symptoms worsen or fail to  improve  CDarlyne RussianPA-C

## 2017-02-04 NOTE — Patient Instructions (Addendum)
-   Return in 2 weeks for a nurse visit to give a urine sample for test of cure - Make sure you use condoms until both you and your partner have completed treatment and have tested negative for Chlamydia  - Recommend you follow-up with Gastroenterology Laury Deep(GI/stomach doctor). I have placed a referral for you to see someone at [facility] - Continue to take daily probiotic - Take Zantac 150mg  as needed for heartburn - Limit alcohol to no more than 2 drinks/day

## 2017-02-06 ENCOUNTER — Encounter: Payer: Self-pay | Admitting: Physician Assistant

## 2017-02-07 DIAGNOSIS — Z789 Other specified health status: Secondary | ICD-10-CM | POA: Insufficient documentation

## 2017-06-20 ENCOUNTER — Encounter: Payer: 59 | Admitting: Physician Assistant

## 2017-06-20 DIAGNOSIS — Z0189 Encounter for other specified special examinations: Secondary | ICD-10-CM

## 2018-02-11 ENCOUNTER — Emergency Department
Admission: EM | Admit: 2018-02-11 | Discharge: 2018-02-11 | Disposition: A | Discharge status: Home / Self Care | Payer: Self-pay | Source: Home / Self Care | Attending: Family Medicine | Admitting: Family Medicine

## 2018-02-11 ENCOUNTER — Other Ambulatory Visit: Payer: Self-pay

## 2018-02-11 ENCOUNTER — Emergency Department (INDEPENDENT_AMBULATORY_CARE_PROVIDER_SITE_OTHER): Payer: Self-pay

## 2018-02-11 DIAGNOSIS — R109 Unspecified abdominal pain: Secondary | ICD-10-CM

## 2018-02-11 DIAGNOSIS — Z8719 Personal history of other diseases of the digestive system: Secondary | ICD-10-CM

## 2018-02-11 DIAGNOSIS — Z8379 Family history of other diseases of the digestive system: Secondary | ICD-10-CM

## 2018-02-11 DIAGNOSIS — R1012 Left upper quadrant pain: Secondary | ICD-10-CM

## 2018-02-11 LAB — POCT CBC W AUTO DIFF (K'VILLE URGENT CARE)

## 2018-02-11 LAB — COMPLETE METABOLIC PANEL WITH GFR
AG Ratio: 1.5 (calc) (ref 1.0–2.5)
ALT: 12 U/L (ref 9–46)
AST: 14 U/L (ref 10–40)
Albumin: 4.6 g/dL (ref 3.6–5.1)
Alkaline phosphatase (APISO): 93 U/L (ref 40–115)
BUN: 13 mg/dL (ref 7–25)
CO2: 27 mmol/L (ref 20–32)
Calcium: 9.3 mg/dL (ref 8.6–10.3)
Chloride: 102 mmol/L (ref 98–110)
Creat: 0.83 mg/dL (ref 0.60–1.35)
GFR, Est African American: 143 mL/min/{1.73_m2} (ref >=60)
GFR, Est Non African American: 123 mL/min/{1.73_m2} (ref >=60)
Globulin: 3.1 g/dL (calc) (ref 1.9–3.7)
Glucose, Bld: 85 mg/dL (ref 65–99)
Potassium: 3.9 mmol/L (ref 3.5–5.3)
Sodium: 138 mmol/L (ref 135–146)
Total Bilirubin: 0.8 mg/dL (ref 0.2–1.2)
Total Protein: 7.7 g/dL (ref 6.1–8.1)

## 2018-02-11 LAB — POCT URINALYSIS DIP (MANUAL ENTRY)
Bilirubin, UA: NEGATIVE
Glucose, UA: NEGATIVE mg/dL
Leukocytes, UA: NEGATIVE
Nitrite, UA: NEGATIVE
Protein Ur, POC: NEGATIVE mg/dL
Spec Grav, UA: 1.02 (ref 1.010–1.025)
Urobilinogen, UA: 0.2 E.U./dL
pH, UA: 7 (ref 5.0–8.0)

## 2018-02-11 LAB — LIPASE: Lipase: 6 U/L — ABNORMAL LOW (ref 7–60)

## 2018-02-11 MED ORDER — ACETAMINOPHEN 325 MG PO TABS
975.0000 mg | ORAL_TABLET | Freq: Once | ORAL | Status: AC
  Administered 2018-02-11: 975 mg via ORAL

## 2018-02-11 MED ORDER — OMEPRAZOLE 20 MG PO CPDR: 20.0000 mg | DELAYED_RELEASE_CAPSULE | Freq: Two times a day (BID) | ORAL | 0 refills | Status: DC

## 2018-02-11 NOTE — ED Triage Notes (Signed)
Pt has a history of pancreatitis.  The last week or so, has been having upper left abdominal pain.  Per patient, he has been drinking excessively, and not eating well.

## 2018-02-11 NOTE — Discharge Instructions (Signed)
°  Please take the omeprazole as prescribed and follow up with your family doctor as needed if the pain is not improving in 1 week.

## 2018-02-11 NOTE — ED Provider Notes (Signed)
[facility]    CSN: [id number] Arrival date & time: 02/11/18  1013     History   Chief Complaint Chief Complaint  Patient presents with  . Abdominal Pain    HPI George Klein is a 27 y.o. male.   HPI George Klein is a 27 y.o. male presenting to UC with c/o LUQ and Left flank pain for about 1 week. Pain is aching and sore. Worse with certain movements or sometimes even at rest.  He does report hx of pancreatitis about 3 years ago and is concerned this might be the same, however, it is more towards the left this time.  He does report drinking an increased amount of alcohol recently and believes that triggered the current pain. He did have an endoscopy 1 year ago for similar pain but was advised the test was normal.  He does not take any prescription medication for GERD. He has not tried any OTC medications for his symptoms. He has not f/u with his PCP since last year.  Denies fever, chills, vomiting or diarrhea. He has had mild nausea. Denies hx of kidney stones or dysuria but he reports "many years ago" he had abdominal pain, trouble urinating, and yellow eyes. He was prescribed a medication and those symptoms resolved.    Past Medical History:  Diagnosis Date  . Acne vulgaris   . Adjustment disorder 2016  . Allergy   . Anxiety   . GERD (gastroesophageal reflux disease)   . Heartburn   . Pancreatitis    Pt states occured after one month of cephalexin. He was drinking alcohol very light and rare. But states preceding month he did not drink alchohol.    Patient Active Problem List   Diagnosis Date Noted  . Drinks beer 02/07/2017  . Melena 02/04/2017  . LUQ pain 11/05/2016  . Gastroesophageal reflux disease 09/20/2016  . 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 History of pancreatitis 11/26/2014    History reviewed. No pertinent surgical history.     Home Medications    Prior to Admission medications   Medication Sig Start Date End Date Taking?  Authorizing Provider  omeprazole (PRILOSEC) 20 MG capsule Take 1 capsule (20 mg total) by mouth 2 (two) times daily before a meal. 02/11/18   Lurene Shadow, PA-C    Family History Family History  Problem Relation Age of Onset  . Hyperlipidemia Father   . Diabetes Maternal Grandmother     Social History Social History   Tobacco Use  . Smoking status: Never Smoker  . Smokeless tobacco: Never Used  Substance Use Topics  . Alcohol use: Yes  . Drug use: Yes    Types: Marijuana     Allergies   Keflex [cephalexin]   Review of Systems Review of Systems  Constitutional: Negative for appetite change, chills, fever and unexpected weight change.  Respiratory: Negative for chest tightness and shortness of breath.   Cardiovascular: Negative for chest pain.  Gastrointestinal: Positive for abdominal pain. Negative for diarrhea, nausea and vomiting.  Genitourinary: Positive for flank pain. Negative for decreased urine volume, dysuria, frequency, hematuria and urgency.  Musculoskeletal: Positive for back pain. Negative for myalgias.  Skin: Negative for color change and rash.     Physical Exam Triage Vital Signs ED Triage Vitals  Enc Vitals Group     BP 02/11/18 1032 112/74     Pulse Rate 02/11/18 1032 60     Resp --      Temp 02/11/18 1032  97.9 F (36.6 C)     Temp Source 02/11/18 1032 Oral     SpO2 02/11/18 1032 100 %     Weight 02/11/18 1034 124 lb (56.2 kg)     Height 02/11/18 1034  (1.676 m)     Head Circumference --      Peak Flow --      Pain Score 02/11/18 1033 6     Pain Loc --      Pain Edu? --      Excl. in GC? --    No data found.  Updated Vital Signs BP 112/74 (BP Location: Right Arm)   Pulse 60   Temp 97.9 F (36.6 C) (Oral)   Ht  (1.676 m)   Wt 124 lb (56.2 kg)   SpO2 100%   BMI 20.01 kg/m   Visual Acuity Right Eye Distance:   Left Eye Distance:   Bilateral Distance:    Right Eye Near:   Left Eye Near:    Bilateral Near:      Physical Exam  Constitutional: He is oriented to person, place, and time. He appears well-developed and well-nourished.  Non-toxic appearance. He does not appear ill. No distress.  HENT:  Head: Normocephalic and atraumatic.  Mouth/Throat: Oropharynx is clear and moist.  Eyes: EOM are normal.  Neck: Normal range of motion.  Cardiovascular: Normal rate and regular rhythm.  Pulmonary/Chest: Effort normal and breath sounds normal. No stridor. No respiratory distress. He has no wheezes. He has no rhonchi. He has no rales. He exhibits no tenderness.  Abdominal: Normal appearance and bowel sounds are normal. There is tenderness in the left upper quadrant. There is CVA tenderness (Left). There is no rigidity.  Musculoskeletal: Normal range of motion.  Neurological: He is alert and oriented to person, place, and time.  Skin: Skin is warm and dry.  Psychiatric: He has a normal mood and affect. His behavior is normal.  Nursing note and vitals reviewed.    UC Treatments / Results  Labs (all labs ordered are listed, but only abnormal results are displayed) Labs Reviewed  POCT URINALYSIS DIP (MANUAL ENTRY) - Abnormal; Notable for the following components:      Result Value   Ketones, POC UA small (15) (*)    Blood, UA small (*)    All other components within normal limits  LIPASE  COMPLETE METABOLIC PANEL WITH GFR  POCT CBC W AUTO DIFF ([facility])    EKG None  Radiology Ct Renal Stone Study  Result Date: 02/11/2018 CLINICAL DATA:  Left-sided abdominal pain for 1 week EXAM: CT ABDOMEN AND PELVIS WITHOUT CONTRAST TECHNIQUE: Multidetector CT imaging of the abdomen and pelvis was performed following the standard protocol without IV contrast. COMPARISON:  None. FINDINGS: Lower chest: No acute abnormality. Hepatobiliary: No focal liver abnormality is seen. No gallstones, gallbladder wall thickening, or biliary dilatation. Pancreas: Unremarkable. No pancreatic ductal dilatation or  surrounding inflammatory changes. Spleen: Normal in size without focal abnormality. Adrenals/Urinary Tract: Adrenal glands are within normal limits. The kidneys are well visualized bilaterally without evidence of renal or ureteral calculi. The bladder is decompressed. Stomach/Bowel: Appendicoliths are noted within the distal aspect of the appendix. It does not appear to be dilated and no periappendiceal inflammatory change is noted. No obstructive or inflammatory change is identified. Scattered diverticular changes noted within the sigmoid colon. Vascular/Lymphatic: No significant vascular findings are present. No enlarged abdominal or pelvic lymph nodes. Reproductive: Prostate is unremarkable. Other: No abdominal wall  hernia or abnormality. No abdominopelvic ascites. Musculoskeletal: No acute or significant osseous findings. IMPRESSION: No acute abnormality is identified. Electronically Signed   By: Alcide Clever M.D.   On: 02/11/2018 11:21    Procedures Procedures (including critical care time)  Medications Ordered in UC Medications  acetaminophen (TYLENOL) tablet 975 mg (975 mg Oral Given 02/11/18 1046)    Initial Impression / Assessment and Plan / UC Course  I have reviewed the triage vital signs and the nursing notes.  Pertinent labs & imaging results that were available during my care of the patient were reviewed by me and considered in my medical decision making (see chart for details).     No acute abdomen. Discussed imaging and labs with pt. Pt info packet provided. Home care instructions provided below.  Discussed symptoms that warrant emergent care in the ED.  Final Clinical Impressions(s) / UC Diagnoses   Final diagnoses:  Left flank pain  LUQ abdominal pain  Family history of GERD  History of pancreatitis     Discharge Instructions      Please take the omeprazole as prescribed and follow up with your family doctor as needed if the pain is not improving in 1 week.      ED Prescriptions    Medication Sig Dispense Auth. Provider   omeprazole (PRILOSEC) 20 MG capsule Take 1 capsule (20 mg total) by mouth 2 (two) times daily before a meal. 60 capsule Lurene Shadow, PA-C     Controlled Substance Prescriptions [place] Controlled Substance Registry consulted? Not Applicable   Rolla Plate 02/11/18 1326

## 2018-02-12 ENCOUNTER — Telehealth: Payer: Self-pay | Admitting: Emergency Medicine

## 2018-11-26 ENCOUNTER — Emergency Department (INDEPENDENT_AMBULATORY_CARE_PROVIDER_SITE_OTHER): Payer: 59

## 2018-11-26 ENCOUNTER — Other Ambulatory Visit: Payer: Self-pay

## 2018-11-26 ENCOUNTER — Encounter: Payer: Self-pay | Admitting: Emergency Medicine

## 2018-11-26 ENCOUNTER — Emergency Department (INDEPENDENT_AMBULATORY_CARE_PROVIDER_SITE_OTHER)
Admission: EM | Admit: 2018-11-26 | Discharge: 2018-11-26 | Disposition: A | Discharge status: Home / Self Care | Payer: 59 | Source: Home / Self Care

## 2018-11-26 DIAGNOSIS — R109 Unspecified abdominal pain: Secondary | ICD-10-CM | POA: Diagnosis not present

## 2018-11-26 DIAGNOSIS — R358 Other polyuria: Secondary | ICD-10-CM | POA: Diagnosis not present

## 2018-11-26 DIAGNOSIS — N2889 Other specified disorders of kidney and ureter: Secondary | ICD-10-CM

## 2018-11-26 DIAGNOSIS — R3589 Other polyuria: Secondary | ICD-10-CM

## 2018-11-26 DIAGNOSIS — R3129 Other microscopic hematuria: Secondary | ICD-10-CM

## 2018-11-26 LAB — POCT URINALYSIS DIP (MANUAL ENTRY)
Bilirubin, UA: NEGATIVE
Glucose, UA: NEGATIVE mg/dL
Ketones, POC UA: NEGATIVE mg/dL
Leukocytes, UA: NEGATIVE
Nitrite, UA: NEGATIVE
Protein Ur, POC: NEGATIVE mg/dL
Spec Grav, UA: 1.02 (ref 1.010–1.025)
Urobilinogen, UA: 0.2 E.U./dL
pH, UA: 7 (ref 5.0–8.0)

## 2018-11-26 MED ORDER — ACETAMINOPHEN 325 MG PO TABS
650.0000 mg | ORAL_TABLET | Freq: Once | ORAL | Status: AC
Start: 2018-11-26 — End: 2018-11-26
  Administered 2018-11-26: 650 mg via ORAL

## 2018-11-26 MED ORDER — TRAMADOL HCL 50 MG PO TABS: 50.0000 mg | ORAL_TABLET | Freq: Four times a day (QID) | ORAL | 0 refills | Status: DC | PRN

## 2018-11-26 NOTE — Discharge Instructions (Signed)
°  Tramadol is strong pain medication. While taking, do not drink alcohol, drive, or perform any other activities that requires focus while taking these medications.   You may also take You may take 500mg  acetaminophen every 4-6 hours or in combination with ibuprofen 400-600mg  every 6-8 hours as needed for pain and inflammation.

## 2018-11-26 NOTE — ED Triage Notes (Signed)
Polyuria since yesterday, LBP, not concerned for STD's has been inactive for 1 year.

## 2018-11-26 NOTE — ED Provider Notes (Signed)
[facility]    CSN: [id number] Arrival date & time: 11/26/18  0910     History   Chief Complaint Chief Complaint  Patient presents with  . Polyuria    HPI George Klein is a 28 y.o. male.   HPI George Klein is a 28 y.o. male presenting to UC with c/o Left side flank pain with dysuria since yesterday.  Denies concern for STDs. He has not been sexually active for at least 1 year.  He states he drinks lots of water but did drink soda the other day and thought it was just gas pain but it has concerned to wax and wane. Mild relief with ibuprofen. Denies fever, chills, n/v/d. No hx of UTIs. No hx of kidney stones.   Past Medical History:  Diagnosis Date  . Acne vulgaris   . Adjustment disorder 2016  . Allergy   . Anxiety   . GERD (gastroesophageal reflux disease)   . Heartburn   . Pancreatitis    Pt states occured after one month of cephalexin. He was drinking alcohol very light and rare. But states preceding month he did not drink alchohol.    Patient Active Problem List   Diagnosis Date Noted  . Drinks beer 02/07/2017  . Melena 02/04/2017  . LUQ pain 11/05/2016  . Gastroesophageal reflux disease 09/20/2016  . 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 History of pancreatitis 11/26/2014    History reviewed. No pertinent surgical history.     Home Medications    Prior to Admission medications   Medication Sig Start Date End Date Taking? Authorizing Provider  calcium carbonate (TUMS - DOSED IN MG ELEMENTAL CALCIUM) 500 MG chewable tablet Chew 1 tablet by mouth daily.   Yes [provider]  traMADol (ULTRAM) 50 MG tablet Take 1 tablet (50 mg total) by mouth every 6 (six) hours as needed. 11/26/18   Lurene ShadowPhelps, Semira Stoltzfus O, PA-C    Family History Family History  Problem Relation Age of Onset  . Hyperlipidemia Father   . Diabetes Maternal Grandmother     Social History Social History   Tobacco Use  . Smoking status: Never Smoker  . Smokeless  tobacco: Never Used  Substance Use Topics  . Alcohol use: Yes  . Drug use: Yes    Types: Marijuana     Allergies   Keflex [cephalexin]   Review of Systems Review of Systems  Constitutional: Negative for chills and fever.  Gastrointestinal: Positive for abdominal pain. Negative for diarrhea, nausea and vomiting.  Genitourinary: Positive for dysuria and flank pain. Negative for discharge, frequency, hematuria, penile pain, scrotal swelling, testicular pain and urgency.  Musculoskeletal: Positive for back pain. Negative for myalgias.  Neurological: Negative for dizziness and headaches.     Physical Exam Triage Vital Signs ED Triage Vitals  Enc Vitals Group     BP 11/26/18 0931 124/77     Pulse Rate 11/26/18 0931 62     Resp --      Temp 11/26/18 0931 97.8 F (36.6 C)     Temp Source 11/26/18 0931 Oral     SpO2 11/26/18 0931 100 %     Weight 11/26/18 0932 135 lb (61.2 kg)     Height 11/26/18 0932 5\' 8"  (1.727 m)     Head Circumference --      Peak Flow --      Pain Score 11/26/18 0932 5     Pain Loc --      Pain Edu? --  Excl. in GC? --    No data found.  Updated Vital Signs BP 124/77 (BP Location: Right Arm)   Pulse 62   Temp 97.8 F (36.6 C) (Oral)   Ht 5\' 8"  (1.727 m)   Wt 135 lb (61.2 kg)   SpO2 100%   BMI 20.53 kg/m   Visual Acuity Right Eye Distance:   Left Eye Distance:   Bilateral Distance:    Right Eye Near:   Left Eye Near:    Bilateral Near:     Physical Exam Vitals signs and nursing note reviewed.  Constitutional:      Appearance: He is well-developed.  HENT:     Head: Normocephalic and atraumatic.  Neck:     Musculoskeletal: Normal range of motion.  Cardiovascular:     Rate and Rhythm: Normal rate and regular rhythm.  Pulmonary:     Effort: Pulmonary effort is normal. No respiratory distress.     Breath sounds: Normal breath sounds.  Abdominal:     General: There is no distension.     Palpations: Abdomen is soft. There is no  mass.     Tenderness: There is no abdominal tenderness. There is left CVA tenderness. There is no right CVA tenderness.     Hernia: No hernia is present.  Musculoskeletal: Normal range of motion.  Skin:    General: Skin is warm and dry.  Neurological:     Mental Status: He is alert and oriented to person, place, and time.  Psychiatric:        Behavior: Behavior normal.      UC Treatments / Results  Labs (all labs ordered are listed, but only abnormal results are displayed) Labs Reviewed  POCT URINALYSIS DIP (MANUAL ENTRY) - Abnormal; Notable for the following components:      Result Value   Blood, UA small (*)    All other components within normal limits  URINE CULTURE    EKG None  Radiology US Renal  Result Date: 11/26/2018 CLINICAL DATA:  Constant left flank pain for 2 days. EXAM: RENAL / URINARY TRACT ULTRASOUND COMPLETE COMPARISON:  CT 02/11/2018. FINDINGS: Right Kidney: Renal measurements: 11.4 x 4.6 x 6.1 cm = volume: 167 mL . Echogenicity within normal limits. No mass or hydronephrosis visualized. Mild fullness of the right renal pelvis appears similar to previous CT. Left Kidney: Renal measurements: 12.5 x 5.5 x 5.2 cm = volume: 186 mL. Possible mild upper pole caliectasis. Renal cortical thickness preserved. No focal lesion. Bladder: Appears normal for the degree of bladder distention. Bilateral ureteral jets are visualized. Bladder imaging was performed before and after voiding. There is a small postvoid residual of 103 cc. IMPRESSION: No definite acute findings. Possible mild left upper pole caliectasis with bilateral ureteral jets. No calculi visualized. Electronically Signed   By: Carey Bullocks M.D.   On: 11/26/2018 11:24    Procedures Procedures (including critical care time)  Medications Ordered in UC Medications  acetaminophen (TYLENOL) tablet 650 mg (650 mg Oral Given 11/26/18 0957)    Initial Impression / Assessment and Plan / UC Course  I have reviewed  the triage vital signs and the nursing notes.  Pertinent labs & imaging results that were available during my care of the patient were reviewed by me and considered in my medical decision making (see chart for details).     Reviewed imaging with pt Pain could have been from a small renal stone. Encouraged f/u with PCP and urology. Discussed symptoms that warrant  emergent care in the ED.  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 While taking, do not drink alcohol, drive, or perform any other activities that requires focus while taking these medications.   You may also take You may take 500mg  acetaminophen every 4-6 hours or in combination with ibuprofen 400-600mg  every 6-8 hours as needed for pain and inflammation.       ED Prescriptions    Medication Sig Dispense Auth. Provider   traMADol (ULTRAM) 50 MG tablet Take 1 tablet (50 mg total) by mouth every 6 (six) hours as needed. 15 tablet Lurene Shadow, PA-C     Controlled Substance Prescriptions [place] Controlled Substance Registry consulted? Not Applicable   Rolla Plate 11/26/18 1438

## 2018-12-05 ENCOUNTER — Other Ambulatory Visit: Payer: Self-pay | Admitting: Urology

## 2018-12-05 DIAGNOSIS — N130 Hydronephrosis with ureteropelvic junction obstruction: Secondary | ICD-10-CM

## 2018-12-05 DIAGNOSIS — R1012 Left upper quadrant pain: Secondary | ICD-10-CM

## 2019-01-12 ENCOUNTER — Encounter (HOSPITAL_COMMUNITY): Payer: Self-pay

## 2019-01-12 ENCOUNTER — Encounter (HOSPITAL_COMMUNITY): Payer: 59

## 2019-01-22 ENCOUNTER — Ambulatory Visit (HOSPITAL_COMMUNITY): Admission: RE | Admit: 2019-01-22 | Payer: 59 | Source: Ambulatory Visit

## 2019-01-22 ENCOUNTER — Encounter (HOSPITAL_COMMUNITY): Payer: Self-pay

## 2019-08-24 ENCOUNTER — Encounter: Payer: Self-pay | Admitting: Emergency Medicine

## 2019-08-24 ENCOUNTER — Emergency Department
Admission: EM | Admit: 2019-08-24 | Discharge: 2019-08-24 | Disposition: A | Discharge status: Home / Self Care | Payer: 59 | Source: Home / Self Care | Attending: Emergency Medicine | Admitting: Emergency Medicine

## 2019-08-24 ENCOUNTER — Emergency Department (INDEPENDENT_AMBULATORY_CARE_PROVIDER_SITE_OTHER): Payer: Self-pay

## 2019-08-24 ENCOUNTER — Other Ambulatory Visit: Payer: Self-pay

## 2019-08-24 DIAGNOSIS — S92401A Displaced unspecified fracture of right great toe, initial encounter for closed fracture: Secondary | ICD-10-CM

## 2019-08-24 DIAGNOSIS — W228XXA Striking against or struck by other objects, initial encounter: Secondary | ICD-10-CM

## 2019-08-24 DIAGNOSIS — Y9366 Activity, soccer: Secondary | ICD-10-CM

## 2019-08-24 DIAGNOSIS — S92424A Nondisplaced fracture of distal phalanx of right great toe, initial encounter for closed fracture: Secondary | ICD-10-CM

## 2019-08-24 MED ORDER — IBUPROFEN 600 MG PO TABS
600.0000 mg | ORAL_TABLET | Freq: Once | ORAL | Status: AC
  Administered 2019-08-24: 600 mg via ORAL

## 2019-08-24 MED ORDER — TRAMADOL HCL 50 MG PO TABS: 50.0000 mg | ORAL_TABLET | Freq: Four times a day (QID) | ORAL | 0 refills | Status: DC | PRN

## 2019-08-24 NOTE — ED Triage Notes (Signed)
Patient reports right large toe injury yesterday while playing soccer; toe is bruised and painful; no OTCs.   He has not had influenza vacc this season.

## 2019-08-24 NOTE — Discharge Instructions (Addendum)
Please wear your postop shoe. You can take ibuprofen as needed for pain. I have given you tramadol to have for breakthrough pain.  Please check with the pharmacist for safety prior to taking to be sure there is no interaction with Accutane. Keep your foot elevated and apply ice packs for 5 to 10 minutes 3-4 times a day.  Follow-up with your primary care doctor in 10 days and consider rex-ray. Follow-up Dr. Earma Reading

## 2019-08-24 NOTE — ED Provider Notes (Signed)
[facility]    CSN: [id number] Arrival date & time: 08/24/19  1207      History   Chief Complaint Chief Complaint  Patient presents with  . Toe Injury    HPI Elmarie ShileyJovany Klein is a 28 y.o. male.  Patient was in his usual state of health until yesterday afternoon when while playing indoor soccer he struck another players foot and jammed his right great toe.  This morning the toe was ecchymotic and throbbing with pain.  He is having difficulty walking due to the toe pain. HPI  Past Medical History:  Diagnosis Date  . Acne vulgaris   . Adjustment disorder 2016  . Allergy   . Anxiety   . GERD (gastroesophageal reflux disease)   . Heartburn   . Pancreatitis    Pt states occured after one month of cephalexin. He was drinking alcohol very light and rare. But states preceding month he did not drink alchohol.    Patient Active Problem List   Diagnosis Date Noted  . Closed fracture of great toe of right foot 08/24/2019  . Drinks beer 02/07/2017  . Melena 02/04/2017  . LUQ pain 11/05/2016  . Gastroesophageal reflux disease 09/20/2016  . 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 History of pancreatitis 11/26/2014    History reviewed. No pertinent surgical history.     Home Medications    Prior to Admission medications   Medication Sig Start Date End Date Taking? Authorizing Provider  isotretinoin (ACCUTANE) 10 MG capsule Take 40 mg by mouth 2 (two) times daily.   Yes [provider]  calcium carbonate (TUMS - DOSED IN MG ELEMENTAL CALCIUM) 500 MG chewable tablet Chew 1 tablet by mouth daily.    [provider]  traMADol (ULTRAM) 50 MG tablet Take 1 tablet (50 mg total) by mouth every 6 (six) hours as needed. 08/24/19   Collene Gobbleaub,  A, MD    Family History Family History  Problem Relation Age of Onset  . Hyperlipidemia Father   . Diabetes Maternal Grandmother     Social History Social History   Tobacco Use  . Smoking status: Never  Smoker  . Smokeless tobacco: Never Used  Substance Use Topics  . Alcohol use: Yes  . Drug use: Yes    Types: Marijuana     Allergies   Keflex [cephalexin]   Review of Systems Review of Systems  Musculoskeletal:       Severe pain and swelling right great toe.  Ankle and foot are normal.  Skin: Negative.      Physical Exam Triage Vital Signs ED Triage Vitals  Enc Vitals Group     BP 08/24/19 1249 124/73     Pulse Rate 08/24/19 1249 64     Resp 08/24/19 1249 16     Temp 08/24/19 1249 (!) 97.4 F (36.3 C)     Temp Source 08/24/19 1249 Oral     SpO2 08/24/19 1249 99 %     Weight 08/24/19 1250 135 lb (61.2 kg)     Height 08/24/19 1250 5\' 8"  (1.727 m)     Head Circumference --      Peak Flow --      Pain Score 08/24/19 1250 8     Pain Loc --      Pain Edu? --      Excl. in GC? --    No data found.  Updated Vital Signs BP 124/73 (BP Location: Right Arm)   Pulse 64  Temp (!) 97.4 F (36.3 C) (Oral)   Resp 16   Ht 5\' 8"  (1.727 m)   Wt 61.2 kg   SpO2 99%   BMI 20.53 kg/m   Visual Acuity Right Eye Distance:   Left Eye Distance:   Bilateral Distance:    Right Eye Near:   Left Eye Near:    Bilateral Near:     Physical Exam Constitutional:      Appearance: Normal appearance.  Musculoskeletal:     Comments: There is ecchymoses and swelling at the IP joint of his right great toe.  There is a 2 to 3 mm subungual hematoma.  There is decreased range of motion of the toe.  There is swelling which extends to the first MTP joint and some swelling in the webspace between the great toe and second toe  Neurological:     Mental Status: He is alert.      UC Treatments / Results  Labs (all labs ordered are listed, but only abnormal results are displayed) Labs Reviewed - No data to display  EKG   Radiology Dg Foot Complete Right  Result Date: 08/24/2019 CLINICAL DATA:  Great toe injury playing soccer yesterday. Pain with swelling and discoloration. EXAM: RIGHT  FOOT COMPLETE - 3+ VIEW COMPARISON:  None. FINDINGS: The bones appear adequately mineralized. There is a nondisplaced intra-articular fracture involving the medial base of the 1st distal phalanx. The proximal phalanx appears intact. There is no dislocation or other acute fracture. Soft tissue swelling is present in the great toe. No evidence of foreign body. IMPRESSION: Nondisplaced intra-articular fracture of the medial base of the 1st distal phalanx. Electronically Signed   By: Richardean Sale M.D.   On: 08/24/2019 13:33    Procedures Procedures (including critical Klein time)  Medications Ordered in UC Medications  ibuprofen (ADVIL) tablet 600 mg (600 mg Oral Given 08/24/19 1250)    Initial Impression / Assessment and Plan / UC Course  I have reviewed the triage vital signs and the nursing notes. We will proceed with x-rays of the toe Pertinent labs & imaging results that were available during my Klein of the patient were reviewed by me and considered in my medical decision making (see chart for details). There is a fracture of the medial base of the distal phalanx right great toe.  He will be treated with buddy taping and postop shoe.  I did give him tramadol to take for breakthrough pain unresponsive to ibuprofen.  He has 1 previous prescription for tramadol 7 months ago and no other prescriptions.  The tramadol prescription I gave him she is to be taken only if cleared by the pharmacist because he is on Accutane.     Final Clinical Impressions(s) / UC Diagnoses   Final diagnoses:  Closed fracture of phalanx of right great toe, physeal involvement unspecified, unspecified phalanx, initial encounter     Discharge Instructions     Please wear your postop shoe. You can take ibuprofen as needed for pain. I have given you tramadol to have for breakthrough pain. Keep your foot elevated and apply ice packs for 5 to 10 minutes 3-4 times a day.  Follow-up with your primary Klein doctor in 10  days and consider rex-ray.    ED Prescriptions    Medication Sig Dispense Auth. Provider   traMADol (ULTRAM) 50 MG tablet Take 1 tablet (50 mg total) by mouth every 6 (six) hours as needed. 15 tablet Darlyne Russian, MD  I have reviewed the PDMP during this encounter.   Collene Gobble, MD 08/24/19 1431

## 2019-12-02 ENCOUNTER — Ambulatory Visit (INDEPENDENT_AMBULATORY_CARE_PROVIDER_SITE_OTHER)
Admission: RE | Admit: 2019-12-02 | Discharge: 2019-12-02 | Disposition: A | Discharge status: Other Acute Inpatient Hospital | Payer: Self-pay | Source: Ambulatory Visit

## 2019-12-02 ENCOUNTER — Emergency Department (INDEPENDENT_AMBULATORY_CARE_PROVIDER_SITE_OTHER): Payer: Self-pay

## 2019-12-02 ENCOUNTER — Other Ambulatory Visit: Payer: Self-pay

## 2019-12-02 ENCOUNTER — Emergency Department
Admission: EM | Admit: 2019-12-02 | Discharge: 2019-12-02 | Disposition: A | Discharge status: Home / Self Care | Payer: Self-pay | Source: Home / Self Care | Attending: Family Medicine | Admitting: Family Medicine

## 2019-12-02 DIAGNOSIS — K5900 Constipation, unspecified: Secondary | ICD-10-CM

## 2019-12-02 DIAGNOSIS — R1012 Left upper quadrant pain: Secondary | ICD-10-CM

## 2019-12-02 NOTE — Discharge Instructions (Addendum)
Recommend taking daily Miralax, one capful mixed in 4 to 8 ounces of water (once daily) until bowel movements are regular. Recommend increasing natural fiber in diet.  May also take a daily fiber product such as Citrucel with plenty of fluid.   If symptoms become significantly worse during the night or over the weekend, proceed to the local emergency room.

## 2019-12-02 NOTE — ED Triage Notes (Signed)
Patient presents to Urgent Care with complaints of LUQ abdominal pain and intermittent left sided back pain since a few days ago. Patient reports sometimes the pain goes away and it will move to his right side, worse after eating.

## 2019-12-02 NOTE — ED Provider Notes (Signed)
[facility]    CSN: [id number] Arrival date & time: 12/02/19  2703      History   Chief Complaint Chief Complaint  Patient presents with  . Abdominal Pain    HPI Tawfiq Favila is a 29 y.o. male.   Patient complains of onset of left upper quadrant abdominal pain, radiating to his left back, several days ago. The pain usually occurs about an hour after eating, sometimes radiating to his left back.  He denies nausea/vomiting, fevers, chills, and sweats, and urinary symptoms.  He states that he has been constipated recently. He denies history of thyroid problems.  The history is provided by the patient.  Abdominal Pain Pain location:  LUQ Pain quality: aching   Pain radiates to:  L flank Pain severity:  Moderate Onset quality:  Sudden Duration:  4 days Timing:  Intermittent Progression:  Unchanged Chronicity:  New Context: eating   Relieved by:  Nothing Worsened by:  Eating Ineffective treatments:  None tried Associated symptoms: constipation and diarrhea   Associated symptoms: no anorexia, no belching, no chest pain, no chills, no cough, no dysuria, no fatigue, no fever, no flatus, no hematemesis, no hematochezia, no hematuria, no melena, no nausea, no shortness of breath, no sore throat and no vomiting     Past Medical History:  Diagnosis Date  . Acne vulgaris   . Adjustment disorder 2016  . Allergy   . Anxiety   . GERD (gastroesophageal reflux disease)   . Heartburn   . Pancreatitis    Pt states occured after one month of cephalexin. He was drinking alcohol very light and rare. But states preceding month he did not drink alchohol.    Patient Active Problem List   Diagnosis Date Noted  . Closed fracture of great toe of right foot 08/24/2019  . Drinks beer 02/07/2017  . Melena 02/04/2017  . LUQ pain 11/05/2016  . Gastroesophageal reflux disease 09/20/2016  . 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 History of pancreatitis 11/26/2014     History reviewed. No pertinent surgical history.     Home Medications    Prior to Admission medications   Medication Sig Start Date End Date Taking? Authorizing Provider  isotretinoin (ACCUTANE) 10 MG capsule Take 40 mg by mouth 2 (two) times daily.   Yes [provider]  calcium carbonate (TUMS - DOSED IN MG ELEMENTAL CALCIUM) 500 MG chewable tablet Chew 1 tablet by mouth daily.    [provider]  traMADol (ULTRAM) 50 MG tablet Take 1 tablet (50 mg total) by mouth every 6 (six) hours as needed. 08/24/19   Collene Gobble, MD    Family History Family History  Problem Relation Age of Onset  . Hyperlipidemia Father   . Diabetes Maternal Grandmother   No history of thyroid problems  Social History Social History   Tobacco Use  . Smoking status: Never Smoker  . Smokeless tobacco: Never Used  Substance Use Topics  . Alcohol use: Yes    Comment: socially  . Drug use: Yes    Types: Marijuana     Allergies   Keflex [cephalexin]   Review of Systems Review of Systems  Constitutional: Negative for chills, fatigue and fever.  HENT: Negative for sore throat.   Respiratory: Negative for cough and shortness of breath.   Cardiovascular: Negative for chest pain.  Gastrointestinal: Positive for abdominal pain, constipation and diarrhea. Negative for anorexia, flatus, hematemesis, hematochezia, melena, nausea and vomiting.  Genitourinary: Negative for dysuria  and hematuria.  All other systems reviewed and are negative.    Physical Exam Triage Vital Signs ED Triage Vitals  Enc Vitals Group     BP 12/02/19 0934 118/77     Pulse Rate 12/02/19 0934 70     Resp 12/02/19 0934 14     Temp 12/02/19 0934 98.2 F (36.8 C)     Temp Source 12/02/19 0934 Oral     SpO2 12/02/19 0934 99 %     Weight --      Height --      Head Circumference --      Peak Flow --      Pain Score 12/02/19 0933 2     Pain Loc --      Pain Edu? --      Excl. in Export? --    No data  found.  Updated Vital Signs BP 118/77 (BP Location: Left Arm)   Pulse 70   Temp 98.2 F (36.8 C) (Oral)   Resp 14   SpO2 99%   Visual Acuity Right Eye Distance:   Left Eye Distance:   Bilateral Distance:    Right Eye Near:   Left Eye Near:    Bilateral Near:     Physical Exam Vitals and nursing note reviewed.  Constitutional:      General: He is not in acute distress. HENT:     Head: Normocephalic.     Nose: Nose normal.     Mouth/Throat:     Mouth: Mucous membranes are moist.     Pharynx: Oropharynx is clear.  Eyes:     Extraocular Movements: Extraocular movements intact.  Cardiovascular:     Rate and Rhythm: Normal rate.     Heart sounds: Normal heart sounds.  Pulmonary:     Breath sounds: Normal breath sounds.  Abdominal:     General: Abdomen is flat. Bowel sounds are normal.     Palpations: Abdomen is soft. There is no hepatomegaly or splenomegaly.     Tenderness: There is abdominal tenderness in the periumbilical area. There is no right CVA tenderness, left CVA tenderness or guarding. Negative signs include McBurney's sign.       Comments: Mid-abdominal tenderness to palpation as noted on diagram.   Musculoskeletal:     Cervical back: Neck supple.     Right lower leg: No edema.     Left lower leg: No edema.  Lymphadenopathy:     Cervical: No cervical adenopathy.  Skin:    General: Skin is warm and dry.     Findings: No rash.  Neurological:     Mental Status: He is alert.      UC Treatments / Results  Labs (all labs ordered are listed, but only abnormal results are displayed) Labs Reviewed  TSH  T4, FREE    EKG   Radiology DG Abdomen 1 View  Result Date: 12/02/2019 CLINICAL DATA:  Left upper quadrant pain for 1 hour. Symptoms for 1 year. EXAM: ABDOMEN - 1 VIEW COMPARISON:  02/11/2018 CT FINDINGS: Moderate amount of ascending colonic stool. No abnormal abdominal calcifications. Left pelvic calcifications are likely vascular and were present  on 02/11/2018. No free intraperitoneal air. There are appendicoliths, when correlated with the prior CT. IMPRESSION: No acute findings. Possible constipation. Electronically Signed   By: Abigail Miyamoto M.D.   On: 12/02/2019 10:57    Procedures Procedures (including critical care time)  Medications Ordered in UC Medications - No data to display  Initial Impression /  Assessment and Plan / UC Course  I have reviewed the triage vital signs and the nursing notes.  Pertinent labs & imaging results that were available during my care of the patient were reviewed by me and considered in my medical decision making (see chart for details).    Suspect constipation as source of his abdominal pain. Check TSH and free T4, Begin Miralax. Followup with Family Doctor in about one week.   Final Clinical Impressions(s) / UC Diagnoses   Final diagnoses:  Abdominal pain, left upper quadrant  Constipation, unspecified constipation type     Discharge Instructions     Recommend taking daily Miralax, one capful mixed in 4 to 8 ounces of water (once daily) until bowel movements are regular. Recommend increasing natural fiber in diet.  May also take a daily fiber product such as Citrucel with plenty of fluid.   If symptoms become significantly worse during the night or over the weekend, proceed to the local emergency room.     ED Prescriptions    None        Lattie Haw, MD 12/03/19 [phone]

## 2019-12-02 NOTE — ED Provider Notes (Signed)
Virtual Visit via Video Note:  [name]  initiated request for Telemedicine visit with [facility] team. I connected with [name]  on 12/02/2019 at 8:37 AM  for a synchronized telemedicine visit using a video enabled HIPPA compliant telemedicine application. I verified that I am speaking with [name]  using two identifiers. Mickie Bail, NP  was physically located in a [facility] Urgent care site and Rajeev Escue was located at a different location.   The limitations of evaluation and management by telemedicine as well as the availability of in-person appointments were discussed. Patient was informed that he  may incur a bill ( including co-pay) for this virtual visit encounter. [name]  expressed understanding and gave verbal consent to proceed with virtual visit.     History of Present Illness:[facility]  is a 29 y.o. male presents for evaluation of 2 day history of LUQ pain which radiates to his back.  He also reports diarrhea and constipation.  Last bowel movement yesterday.  He denies fever, chills, vomiting, dysuria, rash, or other symptoms.     Allergies  Allergen Reactions  . Keflex [Cephalexin] Nausea And Vomiting     Past Medical History:  Diagnosis Date  . Acne vulgaris   . Adjustment disorder 2016  . Allergy   . Anxiety   . GERD (gastroesophageal reflux disease)   . Heartburn   . Pancreatitis    Pt states occured after one month of cephalexin. He was drinking alcohol very light and rare. But states preceding month he did not drink alchohol.     Social History   Tobacco Use  . Smoking status: Never Smoker  . Smokeless tobacco: Never Used  Substance Use Topics  . Alcohol use: Yes  . Drug use: Yes    Types: Marijuana    ROS: as stated in HPI.  All other systems reviewed and negative.      Observations/Objective: Physical Exam  VITALS: Patient denies fever. GENERAL: Alert, appears well and in no acute distress. HEENT:  Atraumatic. NECK: Normal movements of the head and neck. CARDIOPULMONARY: No increased WOB. Speaking in clear sentences. I:E ratio WNL.  MS: Moves all visible extremities without noticeable abnormality. PSYCH: Pleasant and cooperative, well-groomed. Speech normal rate and rhythm. Affect is appropriate. Insight and judgement are appropriate. Attention is focused, linear, and appropriate.  NEURO: CN grossly intact. Oriented as arrived to appointment on time with no prompting. Moves both UE equally.  SKIN: No obvious lesions, wounds, erythema, or cyanosis noted on face or hands.   Assessment and Plan:    ICD-10-CM   1. Left upper quadrant abdominal pain  R10.12        Follow Up Instructions: Discussed with patient that he will need to come to the urgent care for an in person visit to evaluate his abdominal pain.  Patient agrees to plan of care.    I discussed the assessment and treatment plan with the patient. The patient was provided an opportunity to ask questions and all were answered. The patient agreed with the plan and demonstrated an understanding of the instructions.   The patient was advised to call back or seek an in-person evaluation if the symptoms worsen or if the condition fails to improve as anticipated.      Mickie Bail, NP  12/02/2019 8:37 AM         Mickie Bail, NP 12/02/19 [phone]

## 2019-12-02 NOTE — Discharge Instructions (Signed)
Come to the Urgent Care for an in-person evaluation of your abdominal pain.

## 2019-12-03 LAB — T4, FREE: Free T4: 1.3 ng/dL (ref 0.8–1.8)

## 2019-12-03 LAB — TSH: TSH: 0.78 mIU/L (ref 0.40–4.50)

## 2020-09-18 ENCOUNTER — Ambulatory Visit: Payer: Self-pay

## 2020-09-18 ENCOUNTER — Ambulatory Visit
Admission: EM | Admit: 2020-09-18 | Discharge: 2020-09-18 | Disposition: A | Discharge status: Home / Self Care | Payer: Self-pay | Attending: Family Medicine | Admitting: Family Medicine

## 2020-09-18 DIAGNOSIS — Z20822 Contact with and (suspected) exposure to covid-19: Secondary | ICD-10-CM

## 2020-09-18 NOTE — ED Triage Notes (Signed)
Needs covid test

## 2020-09-19 NOTE — ED Provider Notes (Signed)
Needs COVID test an d note   Eustace Moore, MD 09/19/20 1224

## 2020-09-20 LAB — NOVEL CORONAVIRUS, NAA: SARS-CoV-2, NAA: DETECTED — AB

## 2020-09-20 LAB — SARS-COV-2, NAA 2 DAY TAT

## 2020-12-09 IMAGING — DX DG ABDOMEN 1V
2 series · 2 of 2 positions shown · non-contrast
Comparison: 02/11/2018 CT

CLINICAL DATA: Left upper quadrant pain for 1 hour. Symptoms for 1
year.

EXAM:
ABDOMEN - 1 VIEW

[abdomen kub (1 of 2)]
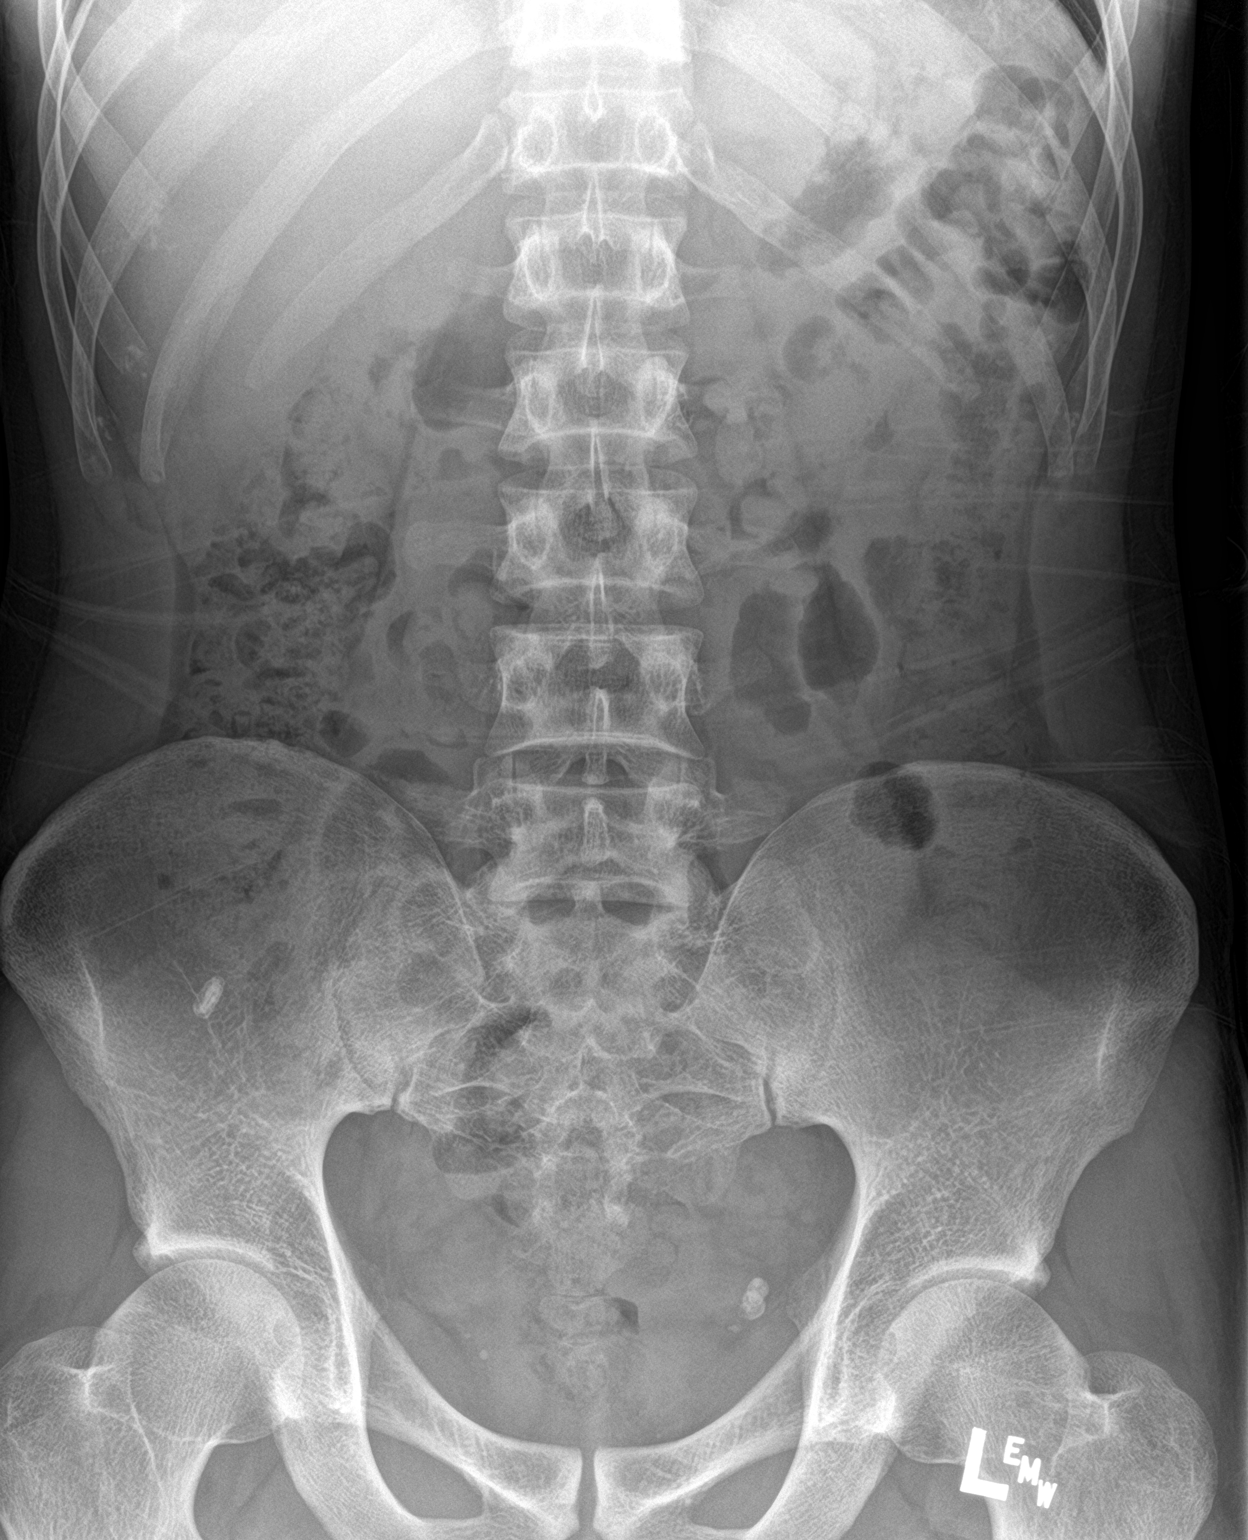

[abdomen kub (2 of 2)]
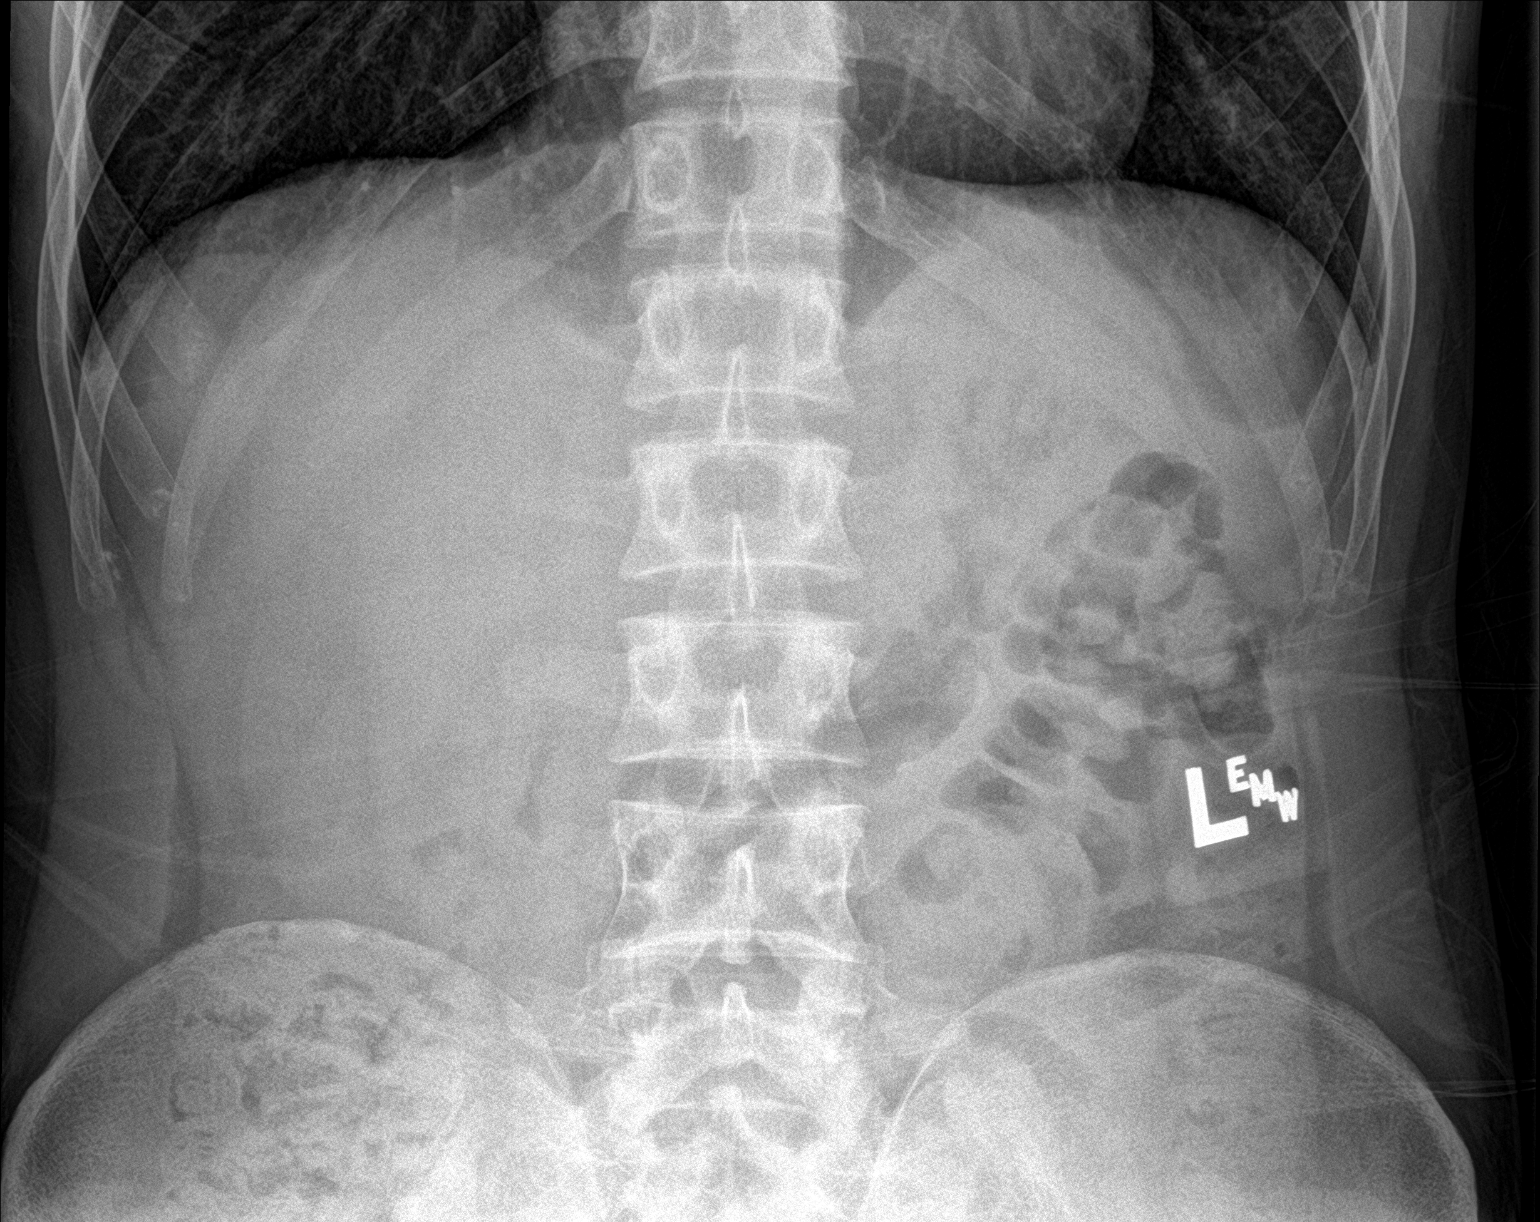

[2 of 2 positions shown; findings below may reference images not displayed]

FINDINGS: Moderate amount of ascending colonic stool. No abnormal abdominal
calcifications. Left pelvic calcifications are likely vascular and
were present on 02/11/2018. No free intraperitoneal air. There are
appendicoliths, when correlated with the prior CT.
IMPRESSION: No acute findings.

Possible constipation.

## 2021-02-16 ENCOUNTER — Emergency Department (INDEPENDENT_AMBULATORY_CARE_PROVIDER_SITE_OTHER): Payer: BC Managed Care – PPO

## 2021-02-16 ENCOUNTER — Other Ambulatory Visit: Payer: Self-pay

## 2021-02-16 ENCOUNTER — Emergency Department
Admission: EM | Admit: 2021-02-16 | Discharge: 2021-02-16 | Disposition: A | Discharge status: Home / Self Care | Payer: BC Managed Care – PPO | Source: Home / Self Care

## 2021-02-16 DIAGNOSIS — M25512 Pain in left shoulder: Secondary | ICD-10-CM | POA: Diagnosis not present

## 2021-02-16 DIAGNOSIS — G4486 Cervicogenic headache: Secondary | ICD-10-CM | POA: Diagnosis not present

## 2021-02-16 DIAGNOSIS — M542 Cervicalgia: Secondary | ICD-10-CM

## 2021-02-16 DIAGNOSIS — S46912A Strain of unspecified muscle, fascia and tendon at shoulder and upper arm level, left arm, initial encounter: Secondary | ICD-10-CM

## 2021-02-16 MED ORDER — BACLOFEN 10 MG PO TABS
10.0000 mg | ORAL_TABLET | Freq: Three times a day (TID) | ORAL | 0 refills | Status: AC
Start: 2021-02-16 — End: ?

## 2021-02-16 MED ORDER — METHYLPREDNISOLONE ACETATE 80 MG/ML IJ SUSP
80.0000 mg | Freq: Once | INTRAMUSCULAR | Status: AC
  Administered 2021-02-16: 80 mg via INTRAMUSCULAR

## 2021-02-16 MED ORDER — PREDNISONE 20 MG PO TABS: ORAL_TABLET | ORAL | 0 refills | Status: AC

## 2021-02-16 NOTE — ED Triage Notes (Signed)
Pt c/o LT shoulder pain and LT side of neck and head x 2 weeks. Some intermittent tingling. Migraines in addition to pain.   Also c/o constipation x 2 weeks. Denies abd pain. Was taking tums last week but stopped.

## 2021-02-16 NOTE — Discharge Instructions (Signed)
Advised patient to take medication as directed with food completion.  Advised patient may use Baclofen 10 mg daily, or as needed.  Instructed patient not to start oral 9-day prednisone taper until tomorrow morning, Friday, 02/17/2021.  Encourage patient increase daily water intake while taking these medications.

## 2021-02-16 NOTE — ED Provider Notes (Signed)
[facility]    CSN: [id number] Arrival date & time: 02/16/21  1643      History   Chief Complaint Chief Complaint  Patient presents with  . Shoulder Pain    LT  . Neck Pain    HPI [facility] Klein is a 30 y.o. male.   HPI 30 year old male presents with left-sided posterior neck pain and left shoulder pain for 2 weeks.  Patient denies injury or insult to these areas, reports 400 mg of Ibuprofen with some relief of symptoms, mainly headache on left side but not left-sided posterior neck pain or left shoulder pain.  Patient reports headaches are normally left-sided temporal area and described as dull/pounding and at times sharp stabbing sensation.  Past Medical History:  Diagnosis Date  . Acne vulgaris   . Adjustment disorder 2016  . Allergy   . Anxiety   . GERD (gastroesophageal reflux disease)   . Heartburn   . Pancreatitis    Pt states occured after one month of cephalexin. He was drinking alcohol very light and rare. But states preceding month he did not drink alchohol.    Patient Active Problem List   Diagnosis Date Noted  . Closed fracture of great toe of right foot 08/24/2019  . Drinks beer 02/07/2017  . Melena 02/04/2017  . LUQ pain 11/05/2016  . Gastroesophageal reflux disease 09/20/2016  . 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 History of pancreatitis 11/26/2014    History reviewed. No pertinent surgical history.     Home Medications    Prior to Admission medications   Medication Sig Start Date End Date Taking? Authorizing Provider  baclofen (LIORESAL) 10 MG tablet Take 1 tablet (10 mg total) by mouth 3 (three) times daily. 02/16/21  Yes Trevor Iha, FNP  predniSONE (DELTASONE) 20 MG tablet Take 3 tabs PO daily x 3 days, then 2 tabs PO daily x 3 days, then 1 tab PO daily x 3 days 02/16/21  Yes Trevor Iha, FNP  calcium carbonate (TUMS - DOSED IN MG ELEMENTAL CALCIUM) 500 MG chewable tablet Chew 1 tablet by mouth daily.    [provider]  isotretinoin (ACCUTANE) 10 MG capsule Take 40 mg by mouth 2 (two) times daily.    [provider]    Family History Family History  Problem Relation Age of Onset  . Hyperlipidemia Father   . Diabetes Maternal Grandmother     Social History Social History   Tobacco Use  . Smoking status: Never Smoker  . Smokeless tobacco: Never Used  Vaping Use  . Vaping Use: Never used  Substance Use Topics  . Alcohol use: Yes    Comment: socially  . Drug use: Yes    Types: Marijuana     Allergies   Keflex [cephalexin]   Review of Systems Review of Systems  Constitutional: Negative.   HENT: Negative.   Respiratory: Negative.   Cardiovascular: Negative.   Gastrointestinal: Negative.   Musculoskeletal: Positive for neck pain and neck stiffness.       Left shoulder pain/left sided posterior neck pain x 2 weeks  Neurological: Positive for headaches.     Physical Exam Triage Vital Signs ED Triage Vitals  Enc Vitals Group     BP 02/16/21 1657 125/83     Pulse Rate 02/16/21 1657 83     Resp 02/16/21 1657 18     Temp 02/16/21 1657 98.5 F (36.9 C)     Temp Source 02/16/21 1657 Oral  SpO2 02/16/21 1657 96 %     Weight --      Height --      Head Circumference --      Peak Flow --      Pain Score 02/16/21 1659 6     Pain Loc --      Pain Edu? --      Excl. in GC? --    No data found.  Updated Vital Signs BP 125/83 (BP Location: Left Arm)   Pulse 83   Temp 98.5 F (36.9 C) (Oral)   Resp 18   SpO2 96%      Physical Exam Vitals and nursing note reviewed.  Constitutional:      General: He is not in acute distress.    Appearance: Normal appearance. He is normal weight. He is not ill-appearing.  HENT:     Head: Normocephalic and atraumatic.     Right Ear: Tympanic membrane, ear canal and external ear normal.     Left Ear: Tympanic membrane, ear canal and external ear normal.     Mouth/Throat:     Mouth: Mucous membranes are moist.      Pharynx: Oropharynx is clear.  Eyes:     Extraocular Movements: Extraocular movements intact.     Conjunctiva/sclera: Conjunctivae normal.     Pupils: Pupils are equal, round, and reactive to light.  Neck:     Comments: LROM w/ 4 planes of movement, TTP over left-sided splenius muscles and left-sided trapezius muscles, muscle adhesions noted over inferior left trapezius group Cardiovascular:     Rate and Rhythm: Normal rate and regular rhythm.     Pulses: Normal pulses.     Heart sounds: Normal heart sounds.  Pulmonary:     Effort: Pulmonary effort is normal. No respiratory distress.     Breath sounds: Normal breath sounds. No wheezing, rhonchi or rales.  Musculoskeletal:     Cervical back: Tenderness present.     Comments: Left shoulder (medial/posterior aspect): TTP over GH joint, LROM with flexion/extension, internal/external rotation, scapular retraction, horizontal abduction/adduction  Lymphadenopathy:     Cervical: No cervical adenopathy.  Skin:    General: Skin is warm and dry.  Neurological:     General: No focal deficit present.     Mental Status: He is alert and oriented to person, place, and time.  Psychiatric:        Mood and Affect: Mood normal.        Behavior: Behavior normal.      UC Treatments / Results  Labs (all labs ordered are listed, but only abnormal results are displayed) Labs Reviewed - No data to display  EKG   Radiology DG Cervical Spine Complete  Result Date: 02/16/2021 CLINICAL DATA:  Neck pain, left shoulder pain EXAM: CERVICAL SPINE - COMPLETE 4+ VIEW COMPARISON:  None. FINDINGS: There is no evidence of cervical spine fracture or prevertebral soft tissue swelling. Alignment is normal. No other significant bone abnormalities are identified. IMPRESSION: Negative cervical spine radiographs. Electronically Signed   By: Charlett Nose M.D.   On: 02/16/2021 17:43   DG Shoulder Left  Result Date: 02/16/2021 CLINICAL DATA:  Neck pain, left shoulder  pain EXAM: LEFT SHOULDER - 2+ VIEW COMPARISON:  None. FINDINGS: There is no evidence of fracture or dislocation. There is no evidence of arthropathy or other focal bone abnormality. Soft tissues are unremarkable. IMPRESSION: Negative. Electronically Signed   By: Charlett Nose M.D.   On: 02/16/2021 17:43  Procedures Procedures (including critical care time)  Medications Ordered in UC Medications  methylPREDNISolone acetate (DEPO-MEDROL) injection 80 mg (80 mg Intramuscular Given 02/16/21 1804)    Initial Impression / Assessment and Plan / UC Course  I have reviewed the triage vital signs and the nursing notes.  Pertinent labs & imaging results that were available during my care of the patient were reviewed by me and considered in my medical decision making (see chart for details).     MDM: 1.  Cervicalgia, 2.  Left shoulder pain, 3.  Left shoulder strain,  4. Cervicogenic Headache.  Discharged home, hemodynamically stable. Final Clinical Impressions(s) / UC Diagnoses   Final diagnoses:  Cervicalgia  Left shoulder pain, unspecified chronicity  Cervicogenic headache  Strain of left shoulder, initial encounter     Discharge Instructions     Advised patient to take medication as directed with food completion.  Advised patient may use Baclofen 10 mg daily, or as needed.  Instructed patient not to start oral 9-day prednisone taper until tomorrow morning, Friday, 02/17/2021.  Encourage patient increase daily water intake while taking these medications.    ED Prescriptions    Medication Sig Dispense Auth. Provider   baclofen (LIORESAL) 10 MG tablet Take 1 tablet (10 mg total) by mouth 3 (three) times daily. 30 each Trevor Iha, FNP   predniSONE (DELTASONE) 20 MG tablet Take 3 tabs PO daily x 3 days, then 2 tabs PO daily x 3 days, then 1 tab PO daily x 3 days 18 tablet Trevor Iha, FNP     PDMP not reviewed this encounter.   Trevor Iha, FNP 02/16/21 Merrily Brittle

## 2022-04-05 ENCOUNTER — Ambulatory Visit: Payer: BC Managed Care – PPO | Admitting: Emergency Medicine
